# Patient Record
Sex: Male | Born: 2000
Health system: Southern US, Community
[De-identification: ages and names within clinical notes are randomized; demographics above are authoritative.]

## PROBLEM LIST (undated history)

## (undated) DIAGNOSIS — F909 Attention-deficit hyperactivity disorder, unspecified type: Secondary | ICD-10-CM

## (undated) HISTORY — PX: NO PAST SURGERIES: SHX2092

## (undated) HISTORY — DX: Attention-deficit hyperactivity disorder, unspecified type: F90.9

---
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
## 2019-01-31 DIAGNOSIS — F902 Attention-deficit hyperactivity disorder, combined type: Secondary | ICD-10-CM | POA: Diagnosis not present

## 2019-01-31 MED FILL — VYVANSE 70 MG CAPSULE: 70 | 30 days supply | Qty: 30 | Fill #0

## 2019-03-03 MED FILL — VYVANSE 70 MG CAPSULE: 70 | 30 days supply | Qty: 30 | Fill #0

## 2019-03-28 ENCOUNTER — Encounter: Payer: Self-pay | Admitting: Family Medicine

## 2019-03-28 ENCOUNTER — Ambulatory Visit (INDEPENDENT_AMBULATORY_CARE_PROVIDER_SITE_OTHER): Payer: 59 | Admitting: Family Medicine

## 2019-03-28 ENCOUNTER — Ambulatory Visit: Payer: Self-pay

## 2019-03-28 DIAGNOSIS — M79644 Pain in right finger(s): Secondary | ICD-10-CM | POA: Diagnosis not present

## 2019-03-28 NOTE — Progress Notes (Signed)
   Office Visit Note   Patient: Jeffery Jennings           Date of Birth: Dec 12, 2000           MRN: [id number] Visit Date: 03/28/2019 Requested by: Harrie Jeans, MD [address] [place],  [place] [address] PCP: Harrie Jeans, MD  Subjective: Chief Complaint  Patient presents with  . 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 Pain with certain movements.    HPI: He is a right-hand-dominant male with right thumb pain.  2 days ago at work at [employer], he got his thumb caught in a refrigerator door.  Immediate severe pain.  Able to continue working but with discomfort.  Pain is on the dorsum of his thumb near the IP joint.  No previous problems with his thumb.  Denies any numbness or tingling.              ROS: No fevers or chills.  All other systems were reviewed and are negative.  Objective: Vital Signs: There were no vitals taken for this visit.  Physical Exam:  General:  Alert and oriented, in no acute distress. Pulm:  Breathing unlabored. Psy:  Normal mood, congruent affect. Skin: No break in the skin.  There is subtle bruising on the dorsum of the proximal phalanx near the IP joint. Right thumb: Flexor and extensor tendon functions are intact.  No laxity of the IP joint or MCP joint collateral ligaments.  No tenderness on the palmar side of his thumb but moderately tender on the dorsum of the proximal phalanx just proximal to the IP joint.  Imaging: X-rays right thumb:  Normal alignment, no obvious fracture.    Assessment & Plan: 1.  2 days status post right thumb contusion with probable bone bruise. - Activities as tolerated.  X-ray in 2 weeks if pain persists.     Procedures: No procedures performed  No notes on file     PMFS History: Patient Active Problem List   Diagnosis Date Noted  . ADHD (attention deficit hyperactivity disorder) 09/05/2011   Past Medical History:  Diagnosis Date  . ADHD (attention  deficit hyperactivity disorder)     Family History  Problem Relation Age of Onset  . Hypertension Father     History reviewed. No pertinent surgical history. Social History   Occupational History  . Not on file  Tobacco Use  . Smoking status: Never Smoker  Substance and Sexual Activity  . Alcohol use: Not on file  . Drug use: Not on file  . Sexual activity: Not on file

## 2019-04-05 MED FILL — VYVANSE 70 MG CAPSULE: 70 | 30 days supply | Qty: 30 | Fill #0

## 2019-05-03 MED FILL — VYVANSE 70 MG CAPSULE: 70 | 30 days supply | Qty: 30 | Fill #0

## 2019-05-31 DIAGNOSIS — F902 Attention-deficit hyperactivity disorder, combined type: Secondary | ICD-10-CM | POA: Diagnosis not present

## 2019-05-31 MED FILL — VYVANSE 70 MG CAPSULE: 70 | 30 days supply | Qty: 30 | Fill #0

## 2019-06-08 ENCOUNTER — Ambulatory Visit: Payer: 59 | Admitting: Physician Assistant

## 2019-07-03 MED FILL — VYVANSE 70 MG CAPSULE: 70 | 30 days supply | Qty: 30 | Fill #0

## 2019-08-07 MED FILL — VYVANSE 70 MG CAPSULE: 70 | 30 days supply | Qty: 30 | Fill #0

## 2019-09-12 DIAGNOSIS — Z0001 Encounter for general adult medical examination with abnormal findings: Secondary | ICD-10-CM | POA: Diagnosis not present

## 2019-09-12 DIAGNOSIS — Z1331 Encounter for screening for depression: Secondary | ICD-10-CM | POA: Diagnosis not present

## 2019-09-12 DIAGNOSIS — Z68.41 Body mass index (BMI) pediatric, 5th percentile to less than 85th percentile for age: Secondary | ICD-10-CM | POA: Diagnosis not present

## 2019-09-12 DIAGNOSIS — Z713 Dietary counseling and surveillance: Secondary | ICD-10-CM | POA: Diagnosis not present

## 2019-09-12 DIAGNOSIS — F902 Attention-deficit hyperactivity disorder, combined type: Secondary | ICD-10-CM | POA: Diagnosis not present

## 2019-09-12 MED FILL — VYVANSE 70 MG CAPSULE: 70 | 30 days supply | Qty: 30 | Fill #0

## 2019-10-13 MED FILL — VYVANSE 70 MG CAPSULE: 70 | 30 days supply | Qty: 30 | Fill #0

## 2019-10-16 ENCOUNTER — Telehealth: Payer: Self-pay

## 2019-10-16 NOTE — Telephone Encounter (Signed)
Note for work for today was requested by pt. This was ok to do per Dr. Prince Rome   Note completed and given to pt.

## 2019-11-16 MED FILL — VYVANSE 70 MG CAPSULE: 70 | 30 days supply | Qty: 30 | Fill #0

## 2020-03-20 ENCOUNTER — Other Ambulatory Visit: Payer: Self-pay

## 2020-03-20 ENCOUNTER — Encounter: Payer: Self-pay | Admitting: Family Medicine

## 2020-03-20 ENCOUNTER — Ambulatory Visit (INDEPENDENT_AMBULATORY_CARE_PROVIDER_SITE_OTHER): Payer: 59 | Admitting: Family Medicine

## 2020-03-20 DIAGNOSIS — F909 Attention-deficit hyperactivity disorder, unspecified type: Secondary | ICD-10-CM | POA: Diagnosis not present

## 2020-03-20 MED ORDER — LISDEXAMFETAMINE DIMESYLATE 60 MG PO CAPS: 60.0000 mg | ORAL_CAPSULE | ORAL | 0 refills | Status: DC

## 2020-03-20 MED FILL — VYVANSE 60 MG CAPSULE: 60 | 30 days supply | Qty: 30 | Fill #0

## 2020-03-20 NOTE — Progress Notes (Signed)
   Office Visit Note   Patient: Jeffery Jennings           Date of Birth: [birth year]           MRN: [id number] Visit Date: 03/20/2020 Requested by: Chales Salmon, MD [address] [place],  [place] [address] PCP: Chales Salmon, MD  Subjective: Chief Complaint  Patient presents with  . Medication Refill    HPI: He is here for ADHD management.  He has "outgrown" his pediatrician office and would like to come here for primary care.  He has been on Vyvanse at the same dosage of 60 mg daily for many years now with good results and no side effects.  He would like to continue on this.  He recently got a new job at [employer].                ROS:  All other systems were reviewed and are negative.  Objective: Vital Signs: There were no vitals taken for this visit.  Physical Exam:  General:  Alert and oriented, in no acute distress. Pulm:  Breathing unlabored. Psy:  Normal mood, congruent affect.  CV: Regular rate and rhythm without murmurs, rubs, or gallops.  No peripheral edema.  2+ radial and posterior tibial pulses. Lungs: Clear to auscultation throughout with no wheezing or areas of consolidation. Ext:  No tremor.   Imaging: No results found.  Assessment & Plan: 1.  ADHD, well controlled -Refilled Vyvanse.  Follow-up in 6 months for recheck.  He will contact me monthly for refills when needed.     Procedures: No procedures performed  No notes on file     PMFS History: Patient Active Problem List   Diagnosis Date Noted  . ADHD (attention deficit hyperactivity disorder) 09/05/2011   Past Medical History:  Diagnosis Date  . ADHD (attention deficit hyperactivity disorder)     Family History  Problem Relation Age of Onset  . Hypertension Father     History reviewed. No pertinent surgical history. Social History   Occupational History  . Not on file  Tobacco Use  . Smoking status: Never Smoker  Substance and Sexual Activity  . Alcohol use: Not on file  . Drug  use: Not on file  . Sexual activity: Not on file

## 2020-04-22 ENCOUNTER — Telehealth: Payer: Self-pay

## 2020-04-22 MED ORDER — LISDEXAMFETAMINE DIMESYLATE 60 MG PO CAPS: 60.0000 mg | ORAL_CAPSULE | ORAL | 0 refills | Status: DC

## 2020-04-22 MED FILL — VYVANSE 60 MG CAPSULE: 60 | 30 days supply | Qty: 30 | Fill #0

## 2020-04-22 NOTE — Addendum Note (Signed)
Addended by: Lillia Carmel on: 04/22/2020 01:30 PM   Modules accepted: Orders

## 2020-04-22 NOTE — Telephone Encounter (Signed)
Patient requests refill on his Vyvanse to [facility]

## 2020-04-22 NOTE — Telephone Encounter (Signed)
Sent!

## 2020-05-23 DIAGNOSIS — H5213 Myopia, bilateral: Secondary | ICD-10-CM | POA: Diagnosis not present

## 2020-06-10 ENCOUNTER — Telehealth: Payer: Self-pay

## 2020-06-10 ENCOUNTER — Other Ambulatory Visit: Payer: Self-pay | Admitting: Family Medicine

## 2020-06-10 MED ORDER — LISDEXAMFETAMINE DIMESYLATE 60 MG PO CAPS: 60.0000 mg | ORAL_CAPSULE | ORAL | 0 refills | Status: DC

## 2020-06-10 MED FILL — VYVANSE 60 MG CAPSULE: 60 | 30 days supply | Qty: 30 | Fill #0

## 2020-06-10 NOTE — Addendum Note (Signed)
Addended by: Lillia Carmel on: 06/10/2020 02:57 PM   Modules accepted: Orders

## 2020-06-10 NOTE — Telephone Encounter (Signed)
Requests refill on Vyvanse, to [facility]. Please advise.

## 2020-06-10 NOTE — Telephone Encounter (Signed)
Sent!

## 2020-08-08 ENCOUNTER — Telehealth: Payer: Self-pay

## 2020-08-08 NOTE — Telephone Encounter (Signed)
Requests refill on Vyvanse to [facility]. He has #3 left.

## 2020-08-11 ENCOUNTER — Other Ambulatory Visit: Payer: Self-pay | Admitting: Family Medicine

## 2020-08-11 MED ORDER — LISDEXAMFETAMINE DIMESYLATE 60 MG PO CAPS: 60.0000 mg | ORAL_CAPSULE | ORAL | 0 refills | Status: DC

## 2020-08-11 NOTE — Telephone Encounter (Signed)
Rx sent 

## 2020-08-11 NOTE — Addendum Note (Signed)
Addended by: Lillia Carmel on: 08/11/2020 06:02 PM   Modules accepted: Orders

## 2020-09-11 ENCOUNTER — Telehealth: Payer: Self-pay

## 2020-09-11 MED ORDER — LISDEXAMFETAMINE DIMESYLATE 60 MG PO CAPS: 60.0000 mg | ORAL_CAPSULE | ORAL | 0 refills | Status: DC

## 2020-09-11 NOTE — Telephone Encounter (Signed)
Written.

## 2020-09-11 NOTE — Telephone Encounter (Signed)
Mom reports the patient has said he thinks he accidentally washed a pair of pants with the written Rx for Vyvanse in it (written so the patient can find which pharmacy has it for the cheapest). He is asking for a new written Rx. Please advise.

## 2020-09-11 NOTE — Telephone Encounter (Signed)
Patient's mom advised - she will take the Rx to him.

## 2020-09-11 NOTE — Addendum Note (Signed)
Addended by: Lillia Carmel on: 09/11/2020 03:13 PM   Modules accepted: Orders

## 2020-09-24 ENCOUNTER — Other Ambulatory Visit: Payer: Self-pay | Admitting: Family Medicine

## 2020-09-24 MED ORDER — AMPHETAMINE-DEXTROAMPHETAMINE 20 MG PO TABS: 40.0000 mg | ORAL_TABLET | Freq: Every day | ORAL | 0 refills | Status: DC

## 2020-11-05 ENCOUNTER — Telehealth: Payer: Self-pay

## 2020-11-05 MED ORDER — AMPHETAMINE-DEXTROAMPHETAMINE 20 MG PO TABS
40.0000 mg | ORAL_TABLET | Freq: Every day | ORAL | 0 refills | Status: DC
  Filled 2020-11-05: qty 60, 30d supply, fill #0

## 2020-11-05 NOTE — Addendum Note (Signed)
Addended by: Lillia Carmel on: 11/05/2020 05:10 PM   Modules accepted: Orders

## 2020-11-05 NOTE — Telephone Encounter (Signed)
The patient needs a refill on his Adderall 40 mg (2 daily) to [facility].

## 2020-11-05 NOTE — Telephone Encounter (Signed)
Advised the patient's mom, Martie Lee.

## 2020-11-05 NOTE — Telephone Encounter (Signed)
Sent!

## 2020-11-06 ENCOUNTER — Other Ambulatory Visit (HOSPITAL_COMMUNITY): Payer: Self-pay

## 2020-11-08 ENCOUNTER — Telehealth: Payer: Self-pay

## 2020-11-08 MED ORDER — AMPHETAMINE-DEXTROAMPHETAMINE 20 MG PO TABS: 40.0000 mg | ORAL_TABLET | Freq: Every day | ORAL | 0 refills | Status: DC

## 2020-11-08 NOTE — Telephone Encounter (Signed)
Patient's mom advised 

## 2020-11-08 NOTE — Addendum Note (Signed)
Addended by: Lillia Carmel on: 11/08/2020 08:49 AM   Modules accepted: Orders

## 2020-11-08 NOTE — Telephone Encounter (Signed)
The patient no longer uses the [facility]. Could the Adderall be sent over to [facility] instead?

## 2020-11-08 NOTE — Telephone Encounter (Signed)
Done

## 2020-11-11 ENCOUNTER — Other Ambulatory Visit (HOSPITAL_COMMUNITY): Payer: Self-pay

## 2020-11-13 ENCOUNTER — Other Ambulatory Visit (HOSPITAL_COMMUNITY): Payer: Self-pay

## 2021-01-02 ENCOUNTER — Telehealth: Payer: Self-pay

## 2021-01-02 NOTE — Telephone Encounter (Signed)
Requests refill on Adderall 20 mg - [facility].

## 2021-01-03 MED ORDER — AMPHETAMINE-DEXTROAMPHETAMINE 20 MG PO TABS: 40.0000 mg | ORAL_TABLET | Freq: Every day | ORAL | 0 refills | Status: DC

## 2021-01-03 NOTE — Addendum Note (Signed)
Addended by: Lillia Carmel on: 01/03/2021 07:57 AM   Modules accepted: Orders

## 2021-02-18 ENCOUNTER — Telehealth: Payer: Self-pay

## 2021-02-18 MED ORDER — AMPHETAMINE-DEXTROAMPHETAMINE 20 MG PO TABS: 40.0000 mg | ORAL_TABLET | Freq: Every day | ORAL | 0 refills | Status: DC

## 2021-02-18 NOTE — Telephone Encounter (Signed)
Requests refill of vyvanse to [facility].

## 2021-02-18 NOTE — Addendum Note (Signed)
Addended by: Lillia Carmel on: 02/18/2021 04:25 PM   Modules accepted: Orders

## 2021-02-21 ENCOUNTER — Telehealth: Payer: Self-pay | Admitting: Radiology

## 2021-02-21 MED ORDER — AMPHETAMINE-DEXTROAMPHETAMINE 20 MG PO TABS: 40.0000 mg | ORAL_TABLET | Freq: Every day | ORAL | 0 refills | Status: DC

## 2021-02-21 NOTE — Telephone Encounter (Signed)
Per patient's mom, Adderall was sent to the incorrect pharmacy. [facility] [place] and [facility] [address]. Could you please resend?

## 2021-02-21 NOTE — Addendum Note (Signed)
Addended by: Lillia Carmel on: 02/21/2021 06:33 PM   Modules accepted: Orders

## 2021-02-24 ENCOUNTER — Telehealth: Payer: Self-pay | Admitting: Radiology

## 2021-02-24 NOTE — Telephone Encounter (Signed)
noted 

## 2021-02-24 NOTE — Telephone Encounter (Signed)
Patient was unable to [facility] due to [facility] [place] [facility] and holding for him. I called [facility] and they canceled rx. I called Summer [facility], [address], and advised. She ran rx again and will get filled for patient.

## 2021-03-21 ENCOUNTER — Telehealth: Payer: Self-pay

## 2021-03-21 MED ORDER — AMPHETAMINE-DEXTROAMPHETAMINE 20 MG PO TABS: 40.0000 mg | ORAL_TABLET | Freq: Every day | ORAL | 0 refills | Status: DC

## 2021-03-21 NOTE — Telephone Encounter (Signed)
The patient is asking for 3 months of the Adderall 40 mg bid, if possible, while he finds a new doctor. [facility] [address] Please advise.

## 2021-03-21 NOTE — Telephone Encounter (Signed)
Patient's mom, Martie Lee, advised.

## 2021-03-21 NOTE — Addendum Note (Signed)
Addended by: Lillia Carmel on: 03/21/2021 11:43 AM   Modules accepted: Orders

## 2021-10-22 ENCOUNTER — Encounter (HOSPITAL_BASED_OUTPATIENT_CLINIC_OR_DEPARTMENT_OTHER): Payer: Self-pay | Admitting: Family Medicine

## 2021-10-22 ENCOUNTER — Other Ambulatory Visit: Payer: Self-pay

## 2021-10-22 ENCOUNTER — Ambulatory Visit (INDEPENDENT_AMBULATORY_CARE_PROVIDER_SITE_OTHER): Payer: Self-pay | Admitting: Family Medicine

## 2021-10-22 VITALS — BP 102/60 | HR 98 | Temp 98.1°F | Ht 68.0 in | Wt 159.2 lb

## 2021-10-22 DIAGNOSIS — Z0000 Encounter for general adult medical examination without abnormal findings: Secondary | ICD-10-CM

## 2021-10-22 DIAGNOSIS — F909 Attention-deficit hyperactivity disorder, unspecified type: Secondary | ICD-10-CM

## 2021-10-22 MED ORDER — AMPHETAMINE-DEXTROAMPHETAMINE 20 MG PO TABS: 40.0000 mg | ORAL_TABLET | Freq: Every day | ORAL | 0 refills | Status: DC

## 2021-10-22 NOTE — Patient Instructions (Addendum)
Living With Attention Deficit Hyperactivity Disorder If you have been diagnosed with attention deficit hyperactivity disorder (ADHD), you may be relieved that you now know why you have felt or behaved a certain way. Still, you may feel overwhelmed about the treatment ahead. You may also wonder how to get the support you need and how to deal with the condition day-to-day. With treatment and support, you can live with ADHD and manage your symptoms. How to manage lifestyle changes Managing stress Stress is your body's reaction to life changes and events, both good and bad. To cope with the stress of an ADHD diagnosis, it may help to: Learn more about ADHD. Exercise regularly. Even a short daily walk can lower stress levels. Participate in training or education programs (including social skills training classes) that teach you to deal with symptoms.  Medicines Your health care provider may suggest certain medicines if he or she feels that they will help to improve your condition. Stimulant medicines are usually prescribed to treat ADHD, and therapy may also be prescribed. It is important to: Avoid using alcohol and other substances that may prevent your medicines from working properly (may interact). Talk with your pharmacist or health care provider about all the medicines that you take, their possible side effects, and what medicines are safe to take together. Make it your goal to take part in all treatment decisions (shared decision-making). Ask about possible side effects of medicines that your health care provider recommends, and tell him or her how you feel about having those side effects. It is best if shared decision-making with your health care provider is part of your total treatment plan. Relationships To strengthen your relationships with family members while treating your condition, consider taking part in family therapy. You might also attend self-help groups alone or with a loved one. Be  honest about how your symptoms affect your relationships. Make an effort to communicate respectfully instead of fighting, and find ways to show others that you care. Psychotherapy may be useful in helping you cope with how ADHD affects your relationships. How to recognize changes in your condition The following signs may mean that your treatment is working well and your condition is improving: Consistently being on time for appointments. Being more organized at home and work. Other people noticing improvements in your behavior. Achieving goals that you set for yourself. Thinking more clearly. The following signs may mean that your treatment is not working very well: Feeling impatience or more confusion. Missing, forgetting, or being late for appointments. An increasing sense of disorganization and messiness. More difficulty in reaching goals that you set for yourself. Loved ones becoming angry or frustrated with you. Follow these instructions at home: Take over-the-counter and prescription medicines only as told by your health care provider. Check with your health care provider before taking any new medicines. Create structure and an organized atmosphere at home. For example: Make a list of tasks, then rank them from most important to least important. Work on one task at a time until your listed tasks are done. Make a daily schedule and follow it consistently every day. Use an appointment calendar, and check it 2 or 3 times a day to keep on track. Keep it with you when you leave the house. Create spaces where you keep certain things, and always put things back in their places after you use them. Keep all follow-up visits as told by your health care provider. This is important. Where to find support Talking to others    Keep emotion out of important discussions and speak in a calm, logical way. ?Listen closely and patiently to your loved ones. Try to understand their point of view, and try to  avoid getting defensive. ?Take responsibility for the consequences of your actions. ?Ask that others do not take your behaviors personally. ?Aim to solve problems as they come up, and express your feelings instead of bottling them up. ?Talk openly about what you need from your loved ones and how they can support you. ?Consider going to family therapy sessions or having your family meet with a specialist who deals with ADHD-related behavior problems. ?Finances ?Not all insurance plans cover mental health care, so it is important to check with your insurance carrier. If paying for co-pays or counseling services is a problem, search for a local or county mental health care center. Public mental health care services may be offered there at a low cost or no cost when you are not able to see a private health care provider. ?If you are taking medicine for ADHD, you may be able to get the generic form, which may be less expensive than brand-name medicine. Some makers of prescription medicines also offer help to patients who cannot afford the medicines that they need. ?Questions to ask your health care provider: ?What are the risks and benefits of taking medicines? ?Would I benefit from therapy? ?How often should I follow up with a health care provider? ?Contact a health care provider if: ?You have side effects from your medicines, such as: ?Repeated muscle twitches, coughing, or speech outbursts. ?Sleep problems. ?Loss of appetite. ?Depression. ?New or worsening behavior problems. ?Dizziness. ?Unusually fast heartbeat. ?Stomach pains. ?Headaches. ?Get help right away if: ?You have a severe reaction to a medicine. ?Your behavior suddenly gets worse. ?Summary ?With treatment and support, you can live with ADHD and manage your symptoms. ?The medicines that are most often prescribed for ADHD are stimulants. ?Consider taking part in family therapy or self-help groups with family members or friends. ?When you talk with friends  and family about your ADHD, be patient and communicate openly. ?Take over-the-counter and prescription medicines only as told by your health care provider. Check with your health care provider before taking any new medicines. ?This information is not intended to replace advice given to you by your health care provider. Make sure you discuss any questions you have with your health care provider. ?Document Revised: 01/03/2020 Document Reviewed: 01/03/2020 ?Elsevier Patient Education ? 2022 Elsevier Inc. ? ?Medication Instructions:  ?Your physician recommends that you continue on your current medications as directed. Please refer to the Current Medication list given to you today. ?--If you need a refill on any your medications before your next appointment, please call your pharmacy first. If no refills are authorized on file call the office.-- ? ? ? ?Follow-Up: ?Your next appointment:   ?Your physician recommends that you schedule a follow-up appointment in: 4 months for a wellness exam with Dr. de [place] ? ?You will receive a text message or e-mail with a link to a survey about your care and experience with [place] today! We would greatly appreciate your feedback!  ? ?Thanks for letting us be apart of your health journey!!  ?Primary Care and Sports Medicine  ? ?Dr. Marcy Salvo de [place]  ? ?We encourage you to activate your patient portal called "MyChart".  Sign up information is provided on this After Visit Summary.  MyChart is used to connect with patients for Virtual Visits (Telemedicine).  Patients are  able to view lab/test results, encounter notes, upcoming appointments, etc.  Non-urgent messages can be sent to your provider as well. To learn more about what you can do with MyChart, please visit --  [email or web address].    ?

## 2021-10-22 NOTE — Progress Notes (Signed)
? ?New Patient Office Visit ? ?Subjective:  ?Patient ID: Jeffery Jennings, male    DOB: [birth year]  Age: 21 y.o. MRN: [id number] ? ?CC:  ?Chief Complaint  ?Patient presents with  ? New Patient (Initial Visit)  ? ? ?HPI ?Jeffery Jennings is a 21 year old male presenting to establish in clinic.  He has current concerns as outlined above.  Reports past medical history of ADHD. ? ?Patient was previously seeing Dr. Prince Rome most recently who is managing medication for ADHD.  He reports that he was diagnosed many years ago as a child.  At 1 point, was treating with Vyvanse, however this became cost prohibitive and he was transitioned to Adderall, 40 mg daily.  He has been on this dose medication for about 1 to 2 years.  Has been tolerating medication well, feels that symptoms are well controlled with current dosing.  He denies any issues with chest pain, palpitations, sleep issues.  Requesting refill today. ? ?Patient is originally from [place], currently works at [employer].  Outside of work his hobbies and activities are somewhat limited ? ?Past Medical History:  ?Diagnosis Date  ? ADHD (attention deficit hyperactivity disorder)   ? ? ?History reviewed. No pertinent surgical history. ? ?Family History  ?Problem Relation Age of Onset  ? Hypertension Father   ? ? ?Social History  ? ?Socioeconomic History  ? Marital status: Single  ?  Spouse name: Not on file  ? Number of children: Not on file  ? Years of education: Not on file  ? Highest education level: Not on file  ?Occupational History  ? Not on file  ?Tobacco Use  ? Smoking status: Never  ? Smokeless tobacco: Not on file  ?Substance and Sexual Activity  ? Alcohol use: Not on file  ? Drug use: Not on file  ? Sexual activity: Not on file  ?Other Topics Concern  ? Not on file  ?Social History Narrative  ? Not on file  ? ?Social Determinants of Health  ? ?Financial Resource Strain: Not on file  ?Food Insecurity: Not on file  ?Transportation Needs: Not on file  ?Physical  Activity: Not on file  ?Stress: Not on file  ?Social Connections: Not on file  ?Intimate Partner Violence: Not on file  ? ? ?Objective:  ? ?Today's Vitals: BP 102/60   Pulse 98   Temp 98.1 ?F (36.7 ?C)   Ht 5\' 8"  (1.727 m)   Wt 159 lb 3.2 oz (72.2 kg)   SpO2 97%   BMI 24.21 kg/m?  ? ?Physical Exam ? ?21 year old male in no acute distress ?Cardiovascular exam with regular rate and rhythm, no murmur appreciated ?Lungs clear to auscultation bilaterally ? ?Assessment & Plan:  ? ?Problem List Items Addressed This Visit   ? ?  ? Other  ? ADHD (attention deficit hyperactivity disorder) - Primary  ?  Reviewed PDMP today, no red flags ?Clinically, patient appears to be tolerating medication well, will provide refill today ?Plan for follow-up in about 3 months to monitor progress with medication as well as complete CPE ?  ?  ? Relevant Medications  ? amphetamine-dextroamphetamine (ADDERALL) 20 MG tablet  ? ?Other Visit Diagnoses   ? ? Wellness examination      ? Relevant Orders  ? CBC with Differential/Platelet  ? Comprehensive metabolic panel  ? Lipid panel  ? ?  ? ? ?Outpatient Encounter Medications as of 10/22/2021  ?Medication Sig  ? MELATONIN PO Take by mouth.  ? [DISCONTINUED] amphetamine-dextroamphetamine (ADDERALL)  20 MG tablet Take 2 tablets (40 mg total) by mouth daily.  ? amphetamine-dextroamphetamine (ADDERALL) 20 MG tablet Take 2 tablets (40 mg total) by mouth daily.  ? [DISCONTINUED] lisdexamfetamine (VYVANSE) 60 MG capsule Take 1 capsule (60 mg total) by mouth every morning.  ? ?No facility-administered encounter medications on file as of 10/22/2021.  ? ? ?Follow-up: Return in about 3 months (around 01/22/2022) for CPE.  Plan for follow-up in about 3 months to complete CPE, monitor medication.  Recommend nurse visit 1 week prior to next appointment to complete labs, will review at next office visit ? ?Faige Seely J [place], MD ? ?

## 2021-10-22 NOTE — Assessment & Plan Note (Signed)
Reviewed PDMP today, no red flags ?Clinically, patient appears to be tolerating medication well, will provide refill today ?Plan for follow-up in about 3 months to monitor progress with medication as well as complete CPE ?

## 2021-12-02 ENCOUNTER — Other Ambulatory Visit (HOSPITAL_BASED_OUTPATIENT_CLINIC_OR_DEPARTMENT_OTHER): Payer: Self-pay | Admitting: Family Medicine

## 2021-12-02 DIAGNOSIS — F909 Attention-deficit hyperactivity disorder, unspecified type: Secondary | ICD-10-CM

## 2021-12-03 MED ORDER — AMPHETAMINE-DEXTROAMPHETAMINE 20 MG PO TABS: 40.0000 mg | ORAL_TABLET | Freq: Every day | ORAL | 0 refills | Status: DC

## 2022-01-12 ENCOUNTER — Other Ambulatory Visit (HOSPITAL_BASED_OUTPATIENT_CLINIC_OR_DEPARTMENT_OTHER): Payer: Self-pay | Admitting: Family Medicine

## 2022-01-12 DIAGNOSIS — F909 Attention-deficit hyperactivity disorder, unspecified type: Secondary | ICD-10-CM

## 2022-01-14 MED ORDER — AMPHETAMINE-DEXTROAMPHETAMINE 20 MG PO TABS: 40.0000 mg | ORAL_TABLET | Freq: Every day | ORAL | 0 refills | Status: DC

## 2022-01-22 ENCOUNTER — Ambulatory Visit (INDEPENDENT_AMBULATORY_CARE_PROVIDER_SITE_OTHER): Payer: Self-pay | Admitting: Family Medicine

## 2022-01-22 ENCOUNTER — Encounter (HOSPITAL_BASED_OUTPATIENT_CLINIC_OR_DEPARTMENT_OTHER): Payer: Self-pay | Admitting: Family Medicine

## 2022-01-22 DIAGNOSIS — Z0000 Encounter for general adult medical examination without abnormal findings: Secondary | ICD-10-CM

## 2022-01-22 NOTE — Progress Notes (Signed)
Subjective:    CC: Annual Physical Exam  HPI:  Jeffery Jennings is a 21 y.o. presenting for annual physical  I reviewed the past medical history, family history, social history, surgical history, and allergies today and no changes were needed.  Please see the problem list section below in epic for further details.  Past Medical History: Past Medical History:  Diagnosis Date   ADHD (attention deficit hyperactivity disorder)    Past Surgical History: History reviewed. No pertinent surgical history. Social History: Social History   Socioeconomic History   Marital status: Single    Spouse name: Not on file   Number of children: Not on file   Years of education: Not on file   Highest education level: Not on file  Occupational History   Not on file  Tobacco Use   Smoking status: Never   Smokeless tobacco: Not on file  Substance and Sexual Activity   Alcohol use: Not on file   Drug use: Not on file   Sexual activity: Not on file  Other Topics Concern   Not on file  Social History Narrative   Not on file   Social Determinants of Health   Financial Resource Strain: Not on file  Food Insecurity: Not on file  Transportation Needs: Not on file  Physical Activity: Not on file  Stress: Not on file  Social Connections: Not on file   Family History: Family History  Problem Relation Age of Onset   Hypertension Father    Allergies: No Known Allergies Medications: See med rec.  Review of Systems: No headache, visual changes, nausea, vomiting, diarrhea, constipation, dizziness, abdominal pain, skin rash, fevers, chills, night sweats, swollen lymph nodes, weight loss, chest pain, body aches, joint swelling, muscle aches, shortness of breath, mood changes, visual or auditory hallucinations.  Objective:    BP 112/67   Pulse 67   Temp 97.8 F (36.6 C) (Oral)   Ht 5\' 8"  (1.727 m)   Wt 167 lb 14.4 oz (76.2 kg)   SpO2 100%   BMI 25.53 kg/m   General: Well Developed, well  nourished, and in no acute distress.  Neuro: Alert and oriented x3, extra-ocular muscles intact, sensation grossly intact. Cranial nerves II through XII are intact, motor, sensory, and coordinative functions are all intact. HEENT: Normocephalic, atraumatic, pupils equal round reactive to light, neck supple, no masses, no lymphadenopathy, thyroid nonpalpable. Oropharynx, nasopharynx, external ear canals are unremarkable. Skin: Warm and dry, no rashes noted.  Cardiac: Regular rate and rhythm, no murmurs rubs or gallops.  Respiratory: Clear to auscultation bilaterally. Not using accessory muscles, speaking in full sentences.  Abdominal: Soft, nontender, nondistended, positive bowel sounds, no masses, no organomegaly.  Musculoskeletal: Shoulder, elbow, wrist, hip, knee, ankle stable, and with full range of motion.  Impression and Recommendations:    Wellness examination Routine HCM labs ordered. HCM reviewed/discussed. Anticipatory guidance regarding healthy weight, lifestyle and choices given. Recommend healthy diet.  Recommend approximately 150 minutes/week of moderate intensity exercise Recommend regular dental and vision exams Always use seatbelt/lap and shoulder restraints Recommend using smoke alarms and checking batteries at least twice a year Recommend using sunscreen when outside Discussed tetanus immunization recommendations, patient is UTD, but will be due later this month. Recommend receiving next booster at [place] with labs or next office visit for med check Patient is UTD with HPV series  Doing well with current dose of Adderall, no concerns at this time related to med. Not needing refill today.  Return in  about 3 months (around 04/24/2022) for Med check.   ___________________________________________ Nandan Willems de [place], MD, ABFM, [facility] Primary [facility] and Sports Medicine [facility]

## 2022-01-22 NOTE — Assessment & Plan Note (Signed)
Routine HCM labs ordered. HCM reviewed/discussed. Anticipatory guidance regarding healthy weight, lifestyle and choices given. Recommend healthy diet.  Recommend approximately 150 minutes/week of moderate intensity exercise Recommend regular dental and vision exams Always use seatbelt/lap and shoulder restraints Recommend using smoke alarms and checking batteries at least twice a year Recommend using sunscreen when outside Discussed tetanus immunization recommendations, patient is UTD, but will be due later this month. Recommend receiving next booster at [place] with labs or next office visit for med check Patient is UTD with HPV series

## 2022-01-22 NOTE — Patient Instructions (Signed)

## 2022-01-26 ENCOUNTER — Ambulatory Visit (HOSPITAL_BASED_OUTPATIENT_CLINIC_OR_DEPARTMENT_OTHER): Payer: Self-pay

## 2022-01-26 DIAGNOSIS — Z0000 Encounter for general adult medical examination without abnormal findings: Secondary | ICD-10-CM

## 2022-01-27 LAB — CBC WITH DIFFERENTIAL/PLATELET
Basophils Absolute: 0.1 10*3/uL (ref 0.0–0.2)
Basos: 1 %
EOS (ABSOLUTE): 0.4 10*3/uL (ref 0.0–0.4)
Eos: 5 %
Hematocrit: 50.1 % (ref 37.5–51.0)
Hemoglobin: 16.6 g/dL (ref 13.0–17.7)
Immature Grans (Abs): 0 10*3/uL (ref 0.0–0.1)
Immature Granulocytes: 0 %
Lymphocytes Absolute: 3.4 10*3/uL — ABNORMAL HIGH (ref 0.7–3.1)
Lymphs: 42 %
MCH: 29.9 pg (ref 26.6–33.0)
MCHC: 33.1 g/dL (ref 31.5–35.7)
MCV: 90 fL (ref 79–97)
Monocytes Absolute: 0.6 10*3/uL (ref 0.1–0.9)
Monocytes: 8 %
Neutrophils Absolute: 3.6 10*3/uL (ref 1.4–7.0)
Neutrophils: 44 %
Platelets: 275 10*3/uL (ref 150–450)
RBC: 5.56 x10E6/uL (ref 4.14–5.80)
RDW: 12.3 % (ref 11.6–15.4)
WBC: 8 10*3/uL (ref 3.4–10.8)

## 2022-01-27 LAB — COMPREHENSIVE METABOLIC PANEL
ALT: 62 IU/L — ABNORMAL HIGH (ref 0–44)
AST: 33 IU/L (ref 0–40)
Albumin/Globulin Ratio: 2 (ref 1.2–2.2)
Albumin: 4.6 g/dL (ref 4.1–5.2)
Alkaline Phosphatase: 47 IU/L (ref 44–121)
BUN/Creatinine Ratio: 14 (ref 9–20)
BUN: 12 mg/dL (ref 6–20)
Bilirubin Total: 0.5 mg/dL (ref 0.0–1.2)
CO2: 25 mmol/L (ref 20–29)
Calcium: 9.7 mg/dL (ref 8.7–10.2)
Chloride: 104 mmol/L (ref 96–106)
Creatinine, Ser: 0.86 mg/dL (ref 0.76–1.27)
Globulin, Total: 2.3 g/dL (ref 1.5–4.5)
Glucose: 90 mg/dL (ref 70–99)
Potassium: 4.5 mmol/L (ref 3.5–5.2)
Sodium: 145 mmol/L — ABNORMAL HIGH (ref 134–144)
Total Protein: 6.9 g/dL (ref 6.0–8.5)
eGFR: 126 mL/min/{1.73_m2} (ref >59)

## 2022-01-27 LAB — LIPID PANEL
Chol/HDL Ratio: 3.6 ratio (ref 0.0–5.0)
Cholesterol, Total: 175 mg/dL (ref 100–199)
HDL: 49 mg/dL (ref >39)
LDL Chol Calc (NIH): 108 mg/dL — ABNORMAL HIGH (ref 0–99)
Triglycerides: 98 mg/dL (ref 0–149)
VLDL Cholesterol Cal: 18 mg/dL (ref 5–40)

## 2022-02-26 ENCOUNTER — Other Ambulatory Visit (HOSPITAL_BASED_OUTPATIENT_CLINIC_OR_DEPARTMENT_OTHER): Payer: Self-pay | Admitting: Family Medicine

## 2022-02-26 DIAGNOSIS — F909 Attention-deficit hyperactivity disorder, unspecified type: Secondary | ICD-10-CM

## 2022-02-27 MED ORDER — AMPHETAMINE-DEXTROAMPHETAMINE 20 MG PO TABS: 40.0000 mg | ORAL_TABLET | Freq: Every day | ORAL | 0 refills | Status: DC

## 2022-04-20 ENCOUNTER — Other Ambulatory Visit (HOSPITAL_BASED_OUTPATIENT_CLINIC_OR_DEPARTMENT_OTHER): Payer: Self-pay | Admitting: Family Medicine

## 2022-04-20 DIAGNOSIS — F909 Attention-deficit hyperactivity disorder, unspecified type: Secondary | ICD-10-CM

## 2022-04-23 ENCOUNTER — Encounter (HOSPITAL_BASED_OUTPATIENT_CLINIC_OR_DEPARTMENT_OTHER): Payer: Self-pay | Admitting: Family Medicine

## 2022-04-23 ENCOUNTER — Ambulatory Visit (INDEPENDENT_AMBULATORY_CARE_PROVIDER_SITE_OTHER): Payer: 59 | Admitting: Family Medicine

## 2022-04-23 DIAGNOSIS — F909 Attention-deficit hyperactivity disorder, unspecified type: Secondary | ICD-10-CM

## 2022-04-23 MED ORDER — AMPHETAMINE-DEXTROAMPHETAMINE 20 MG PO TABS: 40.0000 mg | ORAL_TABLET | Freq: Every day | ORAL | 0 refills | Status: DC

## 2022-04-23 NOTE — Progress Notes (Signed)
    Procedures performed today:    None.  Independent interpretation of notes and tests performed by another provider:   None.  Brief History, Exam, Impression, and Recommendations:    BP 127/74   Pulse 77   Ht 5\' 8"  (1.727 m)   Wt 175 lb 14.4 oz (79.8 kg)   SpO2 99%   BMI 26.75 kg/m   ADHD (attention deficit hyperactivity disorder) Patient reports that he has been doing well with current dose of Adderall, requesting refill today.  He has not had any issues related to chest pain, palpitations, appetite changes, sleep issues.  He feels that symptoms are adequately controlled with current medication regimen On exam, patient is in no acute distress, vital signs stable.  Cardiovascular exam with regular rate and rhythm, no murmur appreciated PDMP reviewed, no red flags Refill of Adderall provided today Plan for follow-up in about 3 months, can be virtual if desired  Return in about 3 months (around 07/23/2022) for med check, can be virtual if preferred.   ___________________________________________ Matea Stanard de [place], MD, ABFM, [facility] Primary [facility] and [place]

## 2022-04-23 NOTE — Assessment & Plan Note (Signed)
Patient reports that he has been doing well with current dose of Adderall, requesting refill today.  He has not had any issues related to chest pain, palpitations, appetite changes, sleep issues.  He feels that symptoms are adequately controlled with current medication regimen On exam, patient is in no acute distress, vital signs stable.  Cardiovascular exam with regular rate and rhythm, no murmur appreciated PDMP reviewed, no red flags Refill of Adderall provided today Plan for follow-up in about 3 months, can be virtual if desired

## 2022-04-23 NOTE — Patient Instructions (Signed)
  Medication Instructions:  Your physician recommends that you continue on your current medications as directed. Please refer to the Current Medication list given to you today. --If you need a refill on any your medications before your next appointment, please call your pharmacy first. If no refills are authorized on file call the office.-- Lab Work: Your physician has recommended that you have lab work today: No If you have labs (blood work) drawn today and your tests are completely normal, you will receive your results via [place] a phone call from our staff.  Please ensure you check your voicemail in the event that you authorized detailed messages to be left on a delegated number. If you have any lab test that is abnormal or we need to change your treatment, we will call you to review the results.  Referrals/Procedures/Imaging: No  Follow-Up: Your next appointment:   Your physician recommends that you schedule a follow-up appointment in: 3 months follow-up it can be virtual with Dr. de [place].  You will receive a text message or e-mail with a link to a survey about your care and experience with [place] today! We would greatly appreciate your feedback!   Thanks for letting us be apart of your health journey!!  Primary Care and Sports Medicine   Dr. Arlina Robes [place]   We encourage you to activate your patient portal called "MyChart".  Sign up information is provided on this After Visit Summary.  MyChart is used to connect with patients for Virtual Visits (Telemedicine).  Patients are able to view lab/test results, encounter notes, upcoming appointments, etc.  Non-urgent messages can be sent to your provider as well. To learn more about what you can do with MyChart, please visit --  [email or web address].

## 2022-05-26 ENCOUNTER — Other Ambulatory Visit (HOSPITAL_BASED_OUTPATIENT_CLINIC_OR_DEPARTMENT_OTHER): Payer: Self-pay | Admitting: Family Medicine

## 2022-05-26 DIAGNOSIS — F909 Attention-deficit hyperactivity disorder, unspecified type: Secondary | ICD-10-CM

## 2022-05-28 MED ORDER — AMPHETAMINE-DEXTROAMPHETAMINE 20 MG PO TABS: 40.0000 mg | ORAL_TABLET | Freq: Every day | ORAL | 0 refills | Status: DC

## 2022-06-08 ENCOUNTER — Ambulatory Visit (INDEPENDENT_AMBULATORY_CARE_PROVIDER_SITE_OTHER): Payer: 59 | Admitting: Orthopedic Surgery

## 2022-06-08 ENCOUNTER — Encounter: Payer: Self-pay | Admitting: Orthopedic Surgery

## 2022-06-08 ENCOUNTER — Ambulatory Visit (INDEPENDENT_AMBULATORY_CARE_PROVIDER_SITE_OTHER): Payer: 59

## 2022-06-08 DIAGNOSIS — M79641 Pain in right hand: Secondary | ICD-10-CM
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
## 2022-06-09 ENCOUNTER — Other Ambulatory Visit: Payer: Self-pay | Admitting: Orthopedic Surgery

## 2022-06-16 ENCOUNTER — Encounter (HOSPITAL_COMMUNITY): Payer: Self-pay | Admitting: Orthopedic Surgery

## 2022-06-16 ENCOUNTER — Other Ambulatory Visit: Payer: Self-pay

## 2022-06-16 NOTE — Progress Notes (Signed)
Jeffery Jennings denies chest pain or shortness of breath. Patient denies having any s/s of Covid in his  household, also denies any known exposure to Covid.   Joselyn Glassman 's PCP is Dr. Sinclair Ship.  Celestine reports that he vapes "a lot", I instructed patient to not vape anymore today.

## 2022-06-17 ENCOUNTER — Ambulatory Visit (HOSPITAL_BASED_OUTPATIENT_CLINIC_OR_DEPARTMENT_OTHER): Payer: 59 | Admitting: Anesthesiology

## 2022-06-17 ENCOUNTER — Ambulatory Visit (HOSPITAL_COMMUNITY): Payer: 59

## 2022-06-17 ENCOUNTER — Encounter (HOSPITAL_COMMUNITY): Payer: Self-pay | Admitting: Orthopedic Surgery

## 2022-06-17 ENCOUNTER — Ambulatory Visit (HOSPITAL_COMMUNITY)
Admission: RE | Admit: 2022-06-17 | Discharge: 2022-06-17 | Disposition: A | Discharge status: Home / Self Care | Payer: 59 | Attending: Orthopedic Surgery | Admitting: Orthopedic Surgery

## 2022-06-17 ENCOUNTER — Encounter (HOSPITAL_COMMUNITY)
Admission: RE | Disposition: A | Discharge status: Home / Self Care | Payer: Self-pay | Source: Home / Self Care | Attending: Orthopedic Surgery

## 2022-06-17 ENCOUNTER — Ambulatory Visit (HOSPITAL_COMMUNITY): Payer: 59 | Admitting: Anesthesiology

## 2022-06-17 ENCOUNTER — Other Ambulatory Visit: Payer: Self-pay

## 2022-06-17 DIAGNOSIS — S62616A Displaced fracture of proximal phalanx of right little finger, initial encounter for closed fracture: Secondary | ICD-10-CM | POA: Diagnosis present

## 2022-06-17 DIAGNOSIS — X58XXXA Exposure to other specified factors, initial encounter: Secondary | ICD-10-CM | POA: Diagnosis not present

## 2022-06-17 HISTORY — PX: CLOSED REDUCTION FINGER WITH PERCUTANEOUS PINNING: SHX5612

## 2022-06-17 SURGERY — CLOSED REDUCTION, FINGER, WITH PERCUTANEOUS PINNING
Anesthesia: Monitor Anesthesia Care | Laterality: Right

## 2022-06-17 MED ORDER — MIDAZOLAM HCL 2 MG/2ML IJ SOLN: 2.0000 mg | Freq: Once | INTRAMUSCULAR | Status: AC

## 2022-06-17 MED ORDER — PROPOFOL 10 MG/ML IV BOLUS
INTRAVENOUS | Status: AC
  Filled 2022-06-17: qty 20

## 2022-06-17 MED ORDER — CHLORHEXIDINE GLUCONATE 0.12 % MT SOLN
OROMUCOSAL | Status: AC
  Administered 2022-06-17: 15 mL via OROMUCOSAL
  Filled 2022-06-17: qty 15

## 2022-06-17 MED ORDER — PROPOFOL 500 MG/50ML IV EMUL
INTRAVENOUS | Status: DC | PRN
  Administered 2022-06-17: 150 ug/kg/min via INTRAVENOUS

## 2022-06-17 MED ORDER — FENTANYL CITRATE (PF) 100 MCG/2ML IJ SOLN: 100.0000 ug | Freq: Once | INTRAMUSCULAR | Status: AC

## 2022-06-17 MED ORDER — PROPOFOL 1000 MG/100ML IV EMUL
INTRAVENOUS | Status: AC
  Filled 2022-06-17: qty 100

## 2022-06-17 MED ORDER — LACTATED RINGERS IV SOLN: INTRAVENOUS | Status: DC

## 2022-06-17 MED ORDER — MIDAZOLAM HCL 2 MG/2ML IJ SOLN
INTRAMUSCULAR | Status: AC
  Administered 2022-06-17: 2 mg via INTRAVENOUS
  Filled 2022-06-17: qty 2

## 2022-06-17 MED ORDER — CEFAZOLIN SODIUM-DEXTROSE 2-4 GM/100ML-% IV SOLN
INTRAVENOUS | Status: AC
  Filled 2022-06-17: qty 100

## 2022-06-17 MED ORDER — FENTANYL CITRATE (PF) 100 MCG/2ML IJ SOLN
INTRAMUSCULAR | Status: AC
  Administered 2022-06-17: 100 ug via INTRAVENOUS
  Filled 2022-06-17: qty 2

## 2022-06-17 MED ORDER — OXYCODONE-ACETAMINOPHEN 5-325 MG PO TABS: 1.0000 | ORAL_TABLET | ORAL | 0 refills | Status: AC | PRN

## 2022-06-17 MED ORDER — ORAL CARE MOUTH RINSE: 15.0000 mL | Freq: Once | OROMUCOSAL | Status: AC

## 2022-06-17 MED ORDER — ACETAMINOPHEN 500 MG PO TABS
1000.0000 mg | ORAL_TABLET | Freq: Once | ORAL | Status: AC
  Administered 2022-06-17: 1000 mg via ORAL
  Filled 2022-06-17: qty 2

## 2022-06-17 MED ORDER — HYDROMORPHONE HCL 1 MG/ML IJ SOLN: 0.2500 mg | INTRAMUSCULAR | Status: DC | PRN

## 2022-06-17 MED ORDER — CHLORHEXIDINE GLUCONATE 0.12 % MT SOLN: 15.0000 mL | Freq: Once | OROMUCOSAL | Status: AC

## 2022-06-17 MED ORDER — CEFAZOLIN SODIUM-DEXTROSE 2-4 GM/100ML-% IV SOLN
2.0000 g | INTRAVENOUS | Status: AC
  Administered 2022-06-17: 2 g via INTRAVENOUS

## 2022-06-17 MED ORDER — CELECOXIB 200 MG PO CAPS
200.0000 mg | ORAL_CAPSULE | Freq: Once | ORAL | Status: AC
  Administered 2022-06-17: 200 mg via ORAL
  Filled 2022-06-17: qty 1

## 2022-06-17 MED ORDER — DEXAMETHASONE SODIUM PHOSPHATE 10 MG/ML IJ SOLN
INTRAMUSCULAR | Status: DC | PRN
  Administered 2022-06-17: 5 mg

## 2022-06-17 MED ORDER — 0.9 % SODIUM CHLORIDE (POUR BTL) OPTIME
TOPICAL | Status: DC | PRN
  Administered 2022-06-17: 500 mL

## 2022-06-17 SURGICAL SUPPLY — 44 items
BAG COUNTER SPONGE SURGICOUNT (BAG) ×2 IMPLANT
BAG SPNG CNTER NS LX DISP (BAG) ×1
BLADE CLIPPER SURG (BLADE) IMPLANT
BNDG CMPR 9X4 STRL LF SNTH (GAUZE/BANDAGES/DRESSINGS)
BNDG ELASTIC 3X5.8 VLCR STR LF (GAUZE/BANDAGES/DRESSINGS) ×2 IMPLANT
BNDG ELASTIC 4X5.8 VLCR STR LF (GAUZE/BANDAGES/DRESSINGS) ×2 IMPLANT
BNDG ESMARK 4X9 LF (GAUZE/BANDAGES/DRESSINGS) IMPLANT
BNDG GAUZE DERMACEA FLUFF 4 (GAUZE/BANDAGES/DRESSINGS) ×2 IMPLANT
BNDG GZE DERMACEA 4 6PLY (GAUZE/BANDAGES/DRESSINGS) ×1
CORD BIPOLAR FORCEPS 12FT (ELECTRODE) IMPLANT
COVER SURGICAL LIGHT HANDLE (MISCELLANEOUS) ×2 IMPLANT
CUFF TOURN SGL QUICK 18X4 (TOURNIQUET CUFF) ×2 IMPLANT
CUFF TOURN SGL QUICK 24 (TOURNIQUET CUFF)
CUFF TRNQT CYL 24X4X16.5-23 (TOURNIQUET CUFF) IMPLANT
DRAPE OEC MINIVIEW 54X84 (DRAPES) ×2 IMPLANT
DRAPE SURG 17X23 STRL (DRAPES) ×2 IMPLANT
GAUZE SPONGE 4X4 12PLY STRL (GAUZE/BANDAGES/DRESSINGS) ×2 IMPLANT
GAUZE XEROFORM 1X8 LF (GAUZE/BANDAGES/DRESSINGS) ×2 IMPLANT
GLOVE SURG SYN 8.0 (GLOVE) ×1 IMPLANT
GLOVE SURG SYN 8.0 PF PI (GLOVE) ×2 IMPLANT
GOWN STRL REUS W/ TWL LRG LVL3 (GOWN DISPOSABLE) ×8 IMPLANT
GOWN STRL REUS W/ TWL XL LVL3 (GOWN DISPOSABLE) ×2 IMPLANT
GOWN STRL REUS W/TWL LRG LVL3 (GOWN DISPOSABLE) ×4
GOWN STRL REUS W/TWL XL LVL3 (GOWN DISPOSABLE) ×1
KIT BASIN OR (CUSTOM PROCEDURE TRAY) ×2 IMPLANT
KIT FRAME INST EXFX 2.0 (INSTRUMENTS) IMPLANT
KIT TURNOVER KIT B (KITS) ×2 IMPLANT
MANIFOLD NEPTUNE II (INSTRUMENTS) IMPLANT
NDL HYPO 25GX1X1/2 BEV (NEEDLE) IMPLANT
NEEDLE HYPO 25GX1X1/2 BEV (NEEDLE) IMPLANT
NS IRRIG 1000ML POUR BTL (IV SOLUTION) ×2 IMPLANT
PACK ORTHO EXTREMITY (CUSTOM PROCEDURE TRAY) ×2 IMPLANT
PAD ARMBOARD 7.5X6 YLW CONV (MISCELLANEOUS) ×2 IMPLANT
PAD CAST 4YDX4 CTTN HI CHSV (CAST SUPPLIES) ×2 IMPLANT
PADDING CAST COTTON 4X4 STRL (CAST SUPPLIES) ×1
SCREW BONE INFRAME 2.0X12 (Screw) IMPLANT
SCREW BONE INFRAME 2.0X24 (Screw) IMPLANT
SPONGE T-LAP 4X18 ~~LOC~~+RFID (SPONGE) IMPLANT
SUT ETHILON 4 0 PS 2 18 (SUTURE) IMPLANT
SYR CONTROL 10ML LL (SYRINGE) IMPLANT
TOWEL GREEN STERILE (TOWEL DISPOSABLE) ×2 IMPLANT
TOWEL GREEN STERILE FF (TOWEL DISPOSABLE) ×2 IMPLANT
TUBE CONNECTING 12X1/4 (SUCTIONS) IMPLANT
WATER STERILE IRR 1000ML POUR (IV SOLUTION) ×2 IMPLANT

## 2022-06-17 NOTE — Anesthesia Procedure Notes (Signed)
Procedure Name: MAC Date/Time: 06/17/2022 3:09 PM  Performed by: Barrington Ellison, CRNAPre-anesthesia Checklist: Patient identified, Emergency Drugs available, Suction available, Patient being monitored and Timeout performed Patient Re-evaluated:Patient Re-evaluated prior to induction Oxygen Delivery Method: Simple face mask

## 2022-06-17 NOTE — Op Note (Signed)
Patient was taken the operating suite and after induction of adequate regional anesthetic IV sedation the right upper extremity was prepped and draped in usual sterile fashion.  An Esmarch was used to exsanguinate the limb tourniquet was inflated 250 mmHg at this point time the fluoroscopic imaging a fracture of the base the proximal phalanx right small finger was reduced with longitudinal traction.  Once this was done we introduced the guidepin for the innate 2.0 intramedullary screw.  Fluoroscopic imaging was used to confirm reduction of the fracture good placement of the pin.  This was driven distally out the proximal phalanx head until the smaller caliber part of the guidepin was at the fracture site.  We then advanced a 24 mm x 2 mm screw from possible distal across the fracture site and cross fashion under fluoroscopic imaging.  The guidepin was then removed and a second 12 mm screw was placed from the radial side proximal to the ulnar side distal using the same technique.  Fluoroscopic imaging in multiple views revealed reduction of the fracture and good placement of the hardware.  We then dressed with Xeroform, 4 x 4's, and an ulnar gutter splint.  The patient tolerated this procedure well went recovery in stable fashion.

## 2022-06-17 NOTE — Transfer of Care (Signed)
Immediate Anesthesia Transfer of Care Note  Patient: Jeffery Jennings  Procedure(s) Performed: closed reduction percutaneous pinning right small finger proximal phalanx fracture (Right)  Patient Location: PACU  Anesthesia Type:MAC and Regional  Level of Consciousness: drowsy and patient cooperative  Airway & Oxygen Therapy: Patient Spontanous Breathing  Post-op Assessment: Report given to RN  Post vital signs: Reviewed and stable  Last Vitals:  Vitals Value Taken Time  BP 114/68 06/17/22 1549  Temp    Pulse 73 06/17/22 1551  Resp 17 06/17/22 1551  SpO2 96 % 06/17/22 1551  Vitals shown include unvalidated device data.  Last Pain:  Vitals:   06/17/22 1322  TempSrc: Oral  PainSc: 2          Complications: No notable events documented.

## 2022-06-17 NOTE — Brief Op Note (Signed)
06/17/2022  3:43 PM  PATIENT:  Jeffery Jennings  21 y.o. male  PRE-OPERATIVE DIAGNOSIS:  right small finger proximal phalanx fracture  POST-OPERATIVE DIAGNOSIS:  right small finger proximal phalanx fracture  PROCEDURE:  Procedure(s): closed reduction percutaneous pinning right small finger proximal phalanx fracture (Right)  SURGEON:  Surgeon(s) and Role:    Dairl Ponder, MD - Primary  PHYSICIAN ASSISTANT:   ASSISTANTS: none   ANESTHESIA:   regional  EBL: None  BLOOD ADMINISTERED:none  DRAINS: none   LOCAL MEDICATIONS USED:  NONE  SPECIMEN:  No Specimen  DISPOSITION OF SPECIMEN:  N/A  COUNTS:  YES  TOURNIQUET:   Total Tourniquet Time Documented: Upper Arm (Right) - 21 minutes Total: Upper Arm (Right) - 21 minutes   DICTATION: .Reubin Milan Dictation  PLAN OF CARE: Discharge to home after PACU  PATIENT DISPOSITION:  PACU - hemodynamically stable.   Delay start of Pharmacological VTE agent (>24hrs) due to surgical blood loss or risk of bleeding: not applicable

## 2022-06-17 NOTE — Anesthesia Procedure Notes (Signed)
Anesthesia Regional Block: Supraclavicular block   Pre-Anesthetic Checklist: , timeout performed,  Correct Patient, Correct Site, Correct Laterality,  Correct Procedure, Correct Position, site marked,  Risks and benefits discussed,  Surgical consent,  Pre-op evaluation,  At surgeon's request and post-op pain management  Laterality: Right  Prep: chloraprep       Needles:  Injection technique: Single-shot  Needle Type: Echogenic Stimulator Needle     Needle Length: 5cm  Needle Gauge: 22     Additional Needles:   Narrative:  Start time: 06/17/2022 2:11 PM End time: 06/17/2022 2:21 PM Injection made incrementally with aspirations every 5 mL.  Performed by: Personally  Anesthesiologist: Heather Roberts, MD  Additional Notes: Functioning IV was confirmed and monitors applied.  A 75mm 22ga echogenic arrow stimulator was used. Sterile prep and drape,hand hygiene and sterile gloves were used.Ultrasound guidance: relevant anatomy identified, needle position confirmed, local anesthetic spread visualized around nerve(s)., vascular puncture avoided.  Image printed for medical record.  Negative aspiration and negative test dose prior to incremental administration of local anesthetic. The patient tolerated the procedure well.

## 2022-06-17 NOTE — Anesthesia Preprocedure Evaluation (Addendum)
Anesthesia Evaluation  Patient identified by MRN, date of birth, ID band Patient awake    Reviewed: Allergy & Precautions, NPO status , Patient's Chart, lab work & pertinent test results  History of Anesthesia Complications Negative for: history of anesthetic complications  Airway Mallampati: II  TM Distance: >3 FB Neck ROM: Full    Dental no notable dental hx. (+) Dental Advisory Given   Pulmonary neg pulmonary ROS   Pulmonary exam normal        Cardiovascular negative cardio ROS Normal cardiovascular exam     Neuro/Psych  PSYCHIATRIC DISORDERS      negative neurological ROS     GI/Hepatic negative GI ROS, Neg liver ROS,,,  Endo/Other  negative endocrine ROS    Renal/GU negative Renal ROS     Musculoskeletal negative musculoskeletal ROS (+)    Abdominal   Peds  Hematology negative hematology ROS (+)   Anesthesia Other Findings   Reproductive/Obstetrics                             Anesthesia Physical Anesthesia Plan  ASA: 2  Anesthesia Plan: MAC and Regional   Post-op Pain Management: Tylenol PO (pre-op)*, Celebrex PO (pre-op)* and Regional block*   Induction: Intravenous  PONV Risk Score and Plan: 1 and Ondansetron and Propofol infusion  Airway Management Planned: Natural Airway  Additional Equipment:   Intra-op Plan:   Post-operative Plan: Extubation in OR  Informed Consent: I have reviewed the patients History and Physical, chart, labs and discussed the procedure including the risks, benefits and alternatives for the proposed anesthesia with the patient or authorized representative who has indicated his/her understanding and acceptance.     Dental advisory given  Plan Discussed with: Anesthesiologist, CRNA and Surgeon  Anesthesia Plan Comments:        Anesthesia Quick Evaluation

## 2022-06-17 NOTE — Anesthesia Postprocedure Evaluation (Signed)
Anesthesia Post Note  Patient: Jeffery Jennings  Procedure(s) Performed: closed reduction percutaneous pinning right small finger proximal phalanx fracture (Right)     Patient location during evaluation: PACU Anesthesia Type: Regional and MAC Level of consciousness: awake and alert Pain management: pain level controlled Vital Signs Assessment: post-procedure vital signs reviewed and stable Respiratory status: spontaneous breathing and respiratory function stable Cardiovascular status: stable Postop Assessment: no apparent nausea or vomiting Anesthetic complications: no   No notable events documented.  Last Vitals:  Vitals:   06/17/22 1600 06/17/22 1615  BP: 120/67 126/76  Pulse: 70 67  Resp: 12 11  Temp:  36.7 C  SpO2: 98% 100%    Last Pain:  Vitals:   06/17/22 1615  TempSrc:   PainSc: 0-No pain                 Tyquasia Pant DANIEL

## 2022-06-17 NOTE — H&P (Signed)
Jeffery Jennings is an 21 y.o. male.   Chief Complaint: Right small finger pain and deformity status post trauma HPI: 21 year old right-hand-dominant male with displaced angulated right small proximal phalanx base fracture  Past Medical History:  Diagnosis Date   ADHD (attention deficit hyperactivity disorder)     Past Surgical History:  Procedure Laterality Date   NO PAST SURGERIES      Family History  Problem Relation Age of Onset   Hypertension Father    Social History:  reports that he has never smoked. He does not have any smokeless tobacco history on file. He reports current alcohol use. He reports that he does not currently use drugs.  Allergies: No Known Allergies  Medications Prior to Admission  Medication Sig Dispense Refill   amphetamine-dextroamphetamine (ADDERALL) 20 MG tablet Take 2 tablets (40 mg total) by mouth daily. 60 tablet 0    No results found for this or any previous visit (from the past 48 hour(s)). No results found.  Review of Systems  All other systems reviewed and are negative.   Blood pressure 130/86, pulse 82, temperature 98.4 F (36.9 C), temperature source Oral, resp. rate 20, height 5\' 8"  (1.727 m), weight 75.8 kg, SpO2 98 %. Physical Exam Constitutional:      Appearance: Normal appearance.  HENT:     Head: Normocephalic and atraumatic.  Eyes:     Pupils: Pupils are equal, round, and reactive to light.  Cardiovascular:     Rate and Rhythm: Normal rate.  Pulmonary:     Effort: Pulmonary effort is normal.  Musculoskeletal:     Right hand: Tenderness and bony tenderness present. Decreased range of motion.     Cervical back: Normal range of motion.     Comments: Right small finger proximal phalanx fracture with angular deformity  Skin:    General: Skin is warm.  Neurological:     General: No focal deficit present.     Mental Status: He is alert and oriented to person, place, and time.  Psychiatric:        Mood and Affect: Mood  normal.        Behavior: Behavior normal.        Thought Content: Thought content normal.        Judgment: Judgment normal.      Assessment/Plan 21 year old male right-hand-dominant displaced proximal phalanx fracture dominant right small finger with angular deformity and loss of function.  I discussed the role of pinning of this fracture with intramedullary fixation as an outpatient.  Patient understands risks and benefits and wishes to proceed  36, MD 06/17/2022, 1:48 PM

## 2022-06-18 ENCOUNTER — Encounter (HOSPITAL_COMMUNITY): Payer: Self-pay | Admitting: Orthopedic Surgery

## 2022-07-13 ENCOUNTER — Other Ambulatory Visit (HOSPITAL_BASED_OUTPATIENT_CLINIC_OR_DEPARTMENT_OTHER): Payer: Self-pay | Admitting: Family Medicine

## 2022-07-13 DIAGNOSIS — F909 Attention-deficit hyperactivity disorder, unspecified type: Secondary | ICD-10-CM

## 2022-07-13 MED ORDER — AMPHETAMINE-DEXTROAMPHETAMINE 20 MG PO TABS: 40.0000 mg | ORAL_TABLET | Freq: Every day | ORAL | 0 refills | Status: DC

## 2022-07-23 ENCOUNTER — Ambulatory Visit (INDEPENDENT_AMBULATORY_CARE_PROVIDER_SITE_OTHER): Payer: 59 | Admitting: Family Medicine

## 2022-07-23 ENCOUNTER — Encounter (HOSPITAL_BASED_OUTPATIENT_CLINIC_OR_DEPARTMENT_OTHER): Payer: Self-pay | Admitting: Family Medicine

## 2022-07-23 VITALS — BP 122/78 | HR 113 | Ht 68.0 in | Wt 172.6 lb

## 2022-07-23 DIAGNOSIS — F909 Attention-deficit hyperactivity disorder, unspecified type: Secondary | ICD-10-CM | POA: Diagnosis not present

## 2022-07-23 NOTE — Progress Notes (Signed)
    Procedures performed today:    None.  Independent interpretation of notes and tests performed by another provider:   None.  Brief History, Exam, Impression, and Recommendations:    BP 122/78 (BP Location: Left Arm, Patient Position: Sitting, Cuff Size: Large)   Pulse (!) 113   Ht 5\' 8"  (1.727 m)   Wt 172 lb 9.6 oz (78.3 kg)   SpO2 100%   BMI 26.24 kg/m   ADHD (attention deficit hyperactivity disorder) Patient presents for follow-up of ADHD management.  He continues with Adderall, has not had any recent dose change.  Feels that symptoms have been well-controlled with this same dose.  Denies any issues with chest pain, palpitations, no appetite concerns, sleeping well at night.  He continues with same employment at [employer]. On exam, vital signs stable, patient is in no acute distress.  Cardiovascular exam with regular rate and rhythm, no murmur appreciated.  Lungs clear to auscultation bilaterally. PDMP reviewed, no red flags.  Patient does not need refill of medications today.  We will plan to continue with current regimen, can provide refill of medication when this comes to next month Plan for follow-up in about 3 months or sooner as needed  Return in about 3 months (around 10/22/2022).   ___________________________________________ Jahmai Finelli de 10/24/2022, MD, ABFM, CAQSM Primary Care and Sports Medicine [facility]

## 2022-07-23 NOTE — Assessment & Plan Note (Signed)
Patient presents for follow-up of ADHD management.  He continues with Adderall, has not had any recent dose change.  Feels that symptoms have been well-controlled with this same dose.  Denies any issues with chest pain, palpitations, no appetite concerns, sleeping well at night.  He continues with same employment at [employer]. On exam, vital signs stable, patient is in no acute distress.  Cardiovascular exam with regular rate and rhythm, no murmur appreciated.  Lungs clear to auscultation bilaterally. PDMP reviewed, no red flags.  Patient does not need refill of medications today.  We will plan to continue with current regimen, can provide refill of medication when this comes to next month Plan for follow-up in about 3 months or sooner as needed

## 2022-08-19 ENCOUNTER — Other Ambulatory Visit (HOSPITAL_BASED_OUTPATIENT_CLINIC_OR_DEPARTMENT_OTHER): Payer: Self-pay | Admitting: Family Medicine

## 2022-08-19 DIAGNOSIS — F909 Attention-deficit hyperactivity disorder, unspecified type: Secondary | ICD-10-CM

## 2022-08-21 MED ORDER — AMPHETAMINE-DEXTROAMPHETAMINE 20 MG PO TABS: 40.0000 mg | ORAL_TABLET | Freq: Every day | ORAL | 0 refills | Status: DC

## 2022-09-30 ENCOUNTER — Other Ambulatory Visit (HOSPITAL_BASED_OUTPATIENT_CLINIC_OR_DEPARTMENT_OTHER): Payer: Self-pay | Admitting: Family Medicine

## 2022-09-30 DIAGNOSIS — F909 Attention-deficit hyperactivity disorder, unspecified type: Secondary | ICD-10-CM

## 2022-10-01 MED ORDER — AMPHETAMINE-DEXTROAMPHETAMINE 20 MG PO TABS: 40.0000 mg | ORAL_TABLET | Freq: Every day | ORAL | 0 refills | Status: DC

## 2022-10-22 ENCOUNTER — Encounter (HOSPITAL_BASED_OUTPATIENT_CLINIC_OR_DEPARTMENT_OTHER): Payer: Self-pay | Admitting: Family Medicine

## 2022-10-22 ENCOUNTER — Ambulatory Visit (INDEPENDENT_AMBULATORY_CARE_PROVIDER_SITE_OTHER): Payer: Managed Care, Other (non HMO) | Admitting: Family Medicine

## 2022-10-22 VITALS — BP 106/70 | HR 79 | Ht 68.0 in | Wt 166.2 lb

## 2022-10-22 DIAGNOSIS — F909 Attention-deficit hyperactivity disorder, unspecified type: Secondary | ICD-10-CM

## 2022-10-22 MED ORDER — AMPHETAMINE-DEXTROAMPHETAMINE 10 MG PO TABS: 10.0000 mg | ORAL_TABLET | Freq: Every day | ORAL | 0 refills | Status: DC

## 2022-10-22 NOTE — Assessment & Plan Note (Addendum)
Patient continues with Adderall 40 mg once daily.  Reports that over the past week or so, he has felt that medication has been less effective at controlling symptoms as compared to previously.  Primarily sites that focus has been somewhat worsened/not controlled as well recently.  He denies any issues with sleep, no chest pain, palpitations, appetite concerns. Discussed options today.  Given that symptoms have become more bothersome with decreased effectiveness of current dose of Adderall, would be reasonable to slightly titrate dose.  Did discuss that oftentimes doses of Adderall above 40 mg daily may not be more effective than 40 mg dose.  Recommend continue to monitor for side effects as increasing dose can increase potential for side effects. If new dose of Adderall does not adequately control symptoms, we can continue with this moving forward.  If symptoms continue to be an adequately controlled, can consider increasing Adderall dose to 60 mg which would be the maximum dose for medication.  Alternatively, could consider switching to alternative stimulant medication.  Previously was prescribed Vyvanse, however this was cost prohibitive at that time Plan for follow-up in 1 month to assess progress with change in Adderall dose

## 2022-10-22 NOTE — Progress Notes (Signed)
    Procedures performed today:    None.  Independent interpretation of notes and tests performed by another provider:   None.  Brief History, Exam, Impression, and Recommendations:    BP 106/70 (BP Location: Left Arm, Patient Position: Sitting, Cuff Size: Normal)   Pulse 79   Ht 5\' 8"  (1.727 m)   Wt 166 lb 3.2 oz (75.4 kg)   SpO2 100%   BMI 25.27 kg/m   ADHD (attention deficit hyperactivity disorder) Patient continues with Adderall 40 mg once daily.  Reports that over the past week or so, he has felt that medication has been less effective at controlling symptoms as compared to previously.  Primarily sites that focus has been somewhat worsened/not controlled as well recently.  He denies any issues with sleep, no chest pain, palpitations, appetite concerns. Discussed options today.  Given that symptoms have become more bothersome with decreased effectiveness of current dose of Adderall, would be reasonable to slightly titrate dose.  Did discuss that oftentimes doses of Adderall above 40 mg daily may not be more effective than 40 mg dose.  Recommend continue to monitor for side effects as increasing dose can increase potential for side effects. If new dose of Adderall does not adequately control symptoms, we can continue with this moving forward.  If symptoms continue to be an adequately controlled, can consider increasing Adderall dose to 60 mg which would be the maximum dose for medication.  Alternatively, could consider switching to alternative stimulant medication.  Previously was prescribed Vyvanse, however this was cost prohibitive at that time Plan for follow-up in 1 month to assess progress with change in Adderall dose  Return in about 4 weeks (around 11/19/2022) for med check.   ___________________________________________ Camdyn Beske de [place], MD, ABFM, [facility] [place]

## 2022-11-05 ENCOUNTER — Other Ambulatory Visit (HOSPITAL_BASED_OUTPATIENT_CLINIC_OR_DEPARTMENT_OTHER): Payer: Self-pay | Admitting: Family Medicine

## 2022-11-05 ENCOUNTER — Encounter (HOSPITAL_BASED_OUTPATIENT_CLINIC_OR_DEPARTMENT_OTHER): Payer: Self-pay | Admitting: Family Medicine

## 2022-11-05 DIAGNOSIS — F909 Attention-deficit hyperactivity disorder, unspecified type: Secondary | ICD-10-CM

## 2022-11-06 MED ORDER — AMPHETAMINE-DEXTROAMPHETAMINE 20 MG PO TABS: 40.0000 mg | ORAL_TABLET | Freq: Every day | ORAL | 0 refills | Status: DC

## 2022-11-10 ENCOUNTER — Other Ambulatory Visit (HOSPITAL_BASED_OUTPATIENT_CLINIC_OR_DEPARTMENT_OTHER): Payer: Self-pay | Admitting: Family Medicine

## 2022-11-10 MED ORDER — AMPHETAMINE-DEXTROAMPHETAMINE 10 MG PO TABS: 10.0000 mg | ORAL_TABLET | Freq: Every day | ORAL | 0 refills | Status: DC

## 2022-11-23 ENCOUNTER — Encounter (HOSPITAL_BASED_OUTPATIENT_CLINIC_OR_DEPARTMENT_OTHER): Payer: Self-pay

## 2022-11-23 ENCOUNTER — Ambulatory Visit (INDEPENDENT_AMBULATORY_CARE_PROVIDER_SITE_OTHER): Payer: Managed Care, Other (non HMO) | Admitting: Family Medicine

## 2022-11-23 ENCOUNTER — Encounter (HOSPITAL_BASED_OUTPATIENT_CLINIC_OR_DEPARTMENT_OTHER): Payer: Self-pay | Admitting: Family Medicine

## 2022-11-29 NOTE — Progress Notes (Signed)
Patient was not seen.

## 2022-12-08 ENCOUNTER — Other Ambulatory Visit (HOSPITAL_BASED_OUTPATIENT_CLINIC_OR_DEPARTMENT_OTHER): Payer: Self-pay | Admitting: Family Medicine

## 2022-12-08 DIAGNOSIS — F909 Attention-deficit hyperactivity disorder, unspecified type: Secondary | ICD-10-CM

## 2022-12-09 MED ORDER — AMPHETAMINE-DEXTROAMPHETAMINE 20 MG PO TABS: 40.0000 mg | ORAL_TABLET | Freq: Every day | ORAL | 0 refills | Status: DC

## 2022-12-09 MED ORDER — AMPHETAMINE-DEXTROAMPHETAMINE 10 MG PO TABS: 10.0000 mg | ORAL_TABLET | Freq: Every day | ORAL | 0 refills | Status: DC

## 2023-01-18 ENCOUNTER — Other Ambulatory Visit (HOSPITAL_BASED_OUTPATIENT_CLINIC_OR_DEPARTMENT_OTHER): Payer: Self-pay | Admitting: Family Medicine

## 2023-01-18 DIAGNOSIS — F909 Attention-deficit hyperactivity disorder, unspecified type: Secondary | ICD-10-CM

## 2023-01-19 MED ORDER — AMPHETAMINE-DEXTROAMPHETAMINE 20 MG PO TABS: 40.0000 mg | ORAL_TABLET | Freq: Every day | ORAL | 0 refills | Status: DC

## 2023-01-19 MED ORDER — AMPHETAMINE-DEXTROAMPHETAMINE 10 MG PO TABS: 10.0000 mg | ORAL_TABLET | Freq: Every day | ORAL | 0 refills | Status: DC

## 2023-01-29 ENCOUNTER — Ambulatory Visit (INDEPENDENT_AMBULATORY_CARE_PROVIDER_SITE_OTHER): Payer: Managed Care, Other (non HMO) | Admitting: Family Medicine

## 2023-01-29 ENCOUNTER — Encounter (HOSPITAL_BASED_OUTPATIENT_CLINIC_OR_DEPARTMENT_OTHER): Payer: Self-pay | Admitting: Family Medicine

## 2023-01-29 VITALS — BP 135/85 | HR 80 | Ht 68.0 in | Wt 161.7 lb

## 2023-01-29 DIAGNOSIS — H6122 Impacted cerumen, left ear: Secondary | ICD-10-CM

## 2023-01-29 NOTE — Progress Notes (Signed)
   Acute Office Visit  Subjective:     Patient ID: Jeffery Jennings, male    DOB: April 23, 2001, 22 y.o.   MRN: [id number]  Chief Complaint  Patient presents with   Ear Fullness    Right side, clogged for about a week, has tried peroxide   Jeffery Jennings is a 22 year-old male patient who presents today for right sided ear fullness. Hydrogen peroxide overnight multiple times. Reports decreased hearing loss. This started about a week ago after swimming and getting water in his ears.   Review of Systems  Constitutional:  Negative for chills and fever.  HENT:  Positive for hearing loss. Negative for congestion, ear discharge, ear pain, sinus pain, sore throat and tinnitus.   Eyes:  Negative for pain.  Respiratory:  Negative for shortness of breath.   Cardiovascular:  Negative for chest pain.  Neurological:  Negative for dizziness and headaches.  Psychiatric/Behavioral:  Negative for depression. The patient is not nervous/anxious.       Objective:    BP 135/85   Pulse 80   Ht 5\' 8"  (1.727 m)   Wt 161 lb 11.2 oz (73.3 kg)   SpO2 100%   BMI 24.59 kg/m   Physical Exam Constitutional:      Appearance: Normal appearance.  HENT:     Right Ear: There is impacted cerumen.     Left Ear: Tympanic membrane and ear canal normal.     Nose: Nose normal. No congestion.     Mouth/Throat:     Mouth: Mucous membranes are moist.     Pharynx: Oropharynx is clear.  Cardiovascular:     Rate and Rhythm: Normal rate and regular rhythm.     Pulses: Normal pulses.     Heart sounds: Normal heart sounds.  Pulmonary:     Effort: Pulmonary effort is normal.     Breath sounds: Normal breath sounds.  Neurological:     Mental Status: He is alert.  Psychiatric:        Mood and Affect: Mood normal.        Behavior: Behavior normal.      Assessment & Plan:  1. Impacted cerumen of left ear Patient presents today for right ear fullness present for the past week. He reports decreased hearing that has not  improved after multiple treatments with hydrogen peroxide solution. Denies ear pain, ear drainage, sinus pain/pressure, headache, sinus pain/pressure, congestion, or rhinorrhea. Physical exam remarkable for right ear canal with cerumen impaction. Ear lavage performed in right ear. Both ear canals are clear without cerumen impaction and discharge. After lavage completed, the left and right tympanic membranes are clearly visible- translucent, pearly grey, and shiny with no bulging or retraction. Normal in appearance with good cone-shaped light reflection of light and smooth consistency. Provided reassurance to patient that no infection, fluid or blood accumulation, swelling, or perforation is present. Advised patient if he notices a decrease in hearing in the future, he can utilize OTC Debrox solution. No indication for antibiotic regimen.     Return if symptoms worsen or fail to improve.  Alyson Reedy, FNP

## 2023-02-22 ENCOUNTER — Ambulatory Visit (INDEPENDENT_AMBULATORY_CARE_PROVIDER_SITE_OTHER): Payer: Managed Care, Other (non HMO) | Admitting: Family Medicine

## 2023-02-22 ENCOUNTER — Encounter (HOSPITAL_BASED_OUTPATIENT_CLINIC_OR_DEPARTMENT_OTHER): Payer: Self-pay | Admitting: Family Medicine

## 2023-02-22 VITALS — BP 125/82 | HR 95 | Ht 68.0 in | Wt 161.8 lb

## 2023-02-22 DIAGNOSIS — F909 Attention-deficit hyperactivity disorder, unspecified type: Secondary | ICD-10-CM | POA: Diagnosis not present

## 2023-02-22 MED ORDER — AMPHETAMINE-DEXTROAMPHETAMINE 20 MG PO TABS
40.0000 mg | ORAL_TABLET | Freq: Every day | ORAL | 0 refills | Status: DC
Start: 2023-02-22 — End: 2023-03-17

## 2023-02-22 MED ORDER — AMPHETAMINE-DEXTROAMPHETAMINE 10 MG PO TABS: 10.0000 mg | ORAL_TABLET | Freq: Every day | ORAL | 0 refills | Status: DC

## 2023-02-22 NOTE — Assessment & Plan Note (Signed)
Patient reports that he is doing well with current dose of Adderall.  At last appointment, we did slightly increase dose.  He does feel that this has been beneficial in regards to symptom control.  He denies any new issues related to sleep, no issues with chest pain, palpitations, appetite concerns. Given progress with dose of Adderall, can continue with this at this time.  It does appear that according to PDMP, he will be needing refill too soon, refill sent to pharmacy on file, no red flags on PDMP Plan for follow-up in about 3 months or sooner as needed

## 2023-02-22 NOTE — Progress Notes (Signed)
    Procedures performed today:    None.  Independent interpretation of notes and tests performed by another provider:   None.  Brief History, Exam, Impression, and Recommendations:    BP 125/82 (BP Location: Right Arm, Patient Position: Sitting, Cuff Size: Normal)   Pulse 95   Ht 5\' 8"  (1.727 m)   Wt 161 lb 12.8 oz (73.4 kg)   SpO2 100%   BMI 24.60 kg/m   Attention deficit hyperactivity disorder (ADHD), unspecified ADHD type Assessment & Plan: Patient reports that he is doing well with current dose of Adderall.  At last appointment, we did slightly increase dose.  He does feel that this has been beneficial in regards to symptom control.  He denies any new issues related to sleep, no issues with chest pain, palpitations, appetite concerns. Given progress with dose of Adderall, can continue with this at this time.  It does appear that according to PDMP, he will be needing refill too soon, refill sent to pharmacy on file, no red flags on PDMP Plan for follow-up in about 3 months or sooner as needed  Orders: -     Amphetamine-Dextroamphetamine; Take 2 tablets (40 mg total) by mouth daily.  Dispense: 60 tablet; Refill: 0  Other orders -     Amphetamine-Dextroamphetamine; Take 1 tablet (10 mg total) by mouth daily.  Dispense: 30 tablet; Refill: 0  Return in about 3 months (around 05/25/2023) for med check.   ___________________________________________ Kenny Rea de [place], MD, ABFM, CAQSM Primary Care and Sports Medicine [facility]

## 2023-03-17 ENCOUNTER — Other Ambulatory Visit (HOSPITAL_BASED_OUTPATIENT_CLINIC_OR_DEPARTMENT_OTHER): Payer: Self-pay | Admitting: Family Medicine

## 2023-03-17 DIAGNOSIS — F909 Attention-deficit hyperactivity disorder, unspecified type: Secondary | ICD-10-CM

## 2023-03-18 MED ORDER — AMPHETAMINE-DEXTROAMPHETAMINE 20 MG PO TABS: 40.0000 mg | ORAL_TABLET | Freq: Every day | ORAL | 0 refills | Status: DC

## 2023-03-18 MED ORDER — AMPHETAMINE-DEXTROAMPHETAMINE 10 MG PO TABS: 10.0000 mg | ORAL_TABLET | Freq: Every day | ORAL | 0 refills | Status: DC

## 2023-04-28 ENCOUNTER — Other Ambulatory Visit (HOSPITAL_BASED_OUTPATIENT_CLINIC_OR_DEPARTMENT_OTHER): Payer: Self-pay | Admitting: Family Medicine

## 2023-04-28 DIAGNOSIS — F909 Attention-deficit hyperactivity disorder, unspecified type: Secondary | ICD-10-CM

## 2023-04-29 MED ORDER — AMPHETAMINE-DEXTROAMPHETAMINE 10 MG PO TABS: 10.0000 mg | ORAL_TABLET | Freq: Every day | ORAL | 0 refills | Status: DC

## 2023-04-29 MED ORDER — AMPHETAMINE-DEXTROAMPHETAMINE 20 MG PO TABS: 40.0000 mg | ORAL_TABLET | Freq: Every day | ORAL | 0 refills | Status: DC

## 2023-04-30 ENCOUNTER — Encounter (HOSPITAL_BASED_OUTPATIENT_CLINIC_OR_DEPARTMENT_OTHER): Payer: Self-pay | Admitting: Family Medicine

## 2023-04-30 ENCOUNTER — Ambulatory Visit (HOSPITAL_BASED_OUTPATIENT_CLINIC_OR_DEPARTMENT_OTHER): Payer: Managed Care, Other (non HMO) | Admitting: Family Medicine

## 2023-04-30 VITALS — BP 139/81 | HR 114 | Ht 68.0 in | Wt 163.3 lb

## 2023-04-30 DIAGNOSIS — F909 Attention-deficit hyperactivity disorder, unspecified type: Secondary | ICD-10-CM | POA: Diagnosis not present

## 2023-04-30 DIAGNOSIS — F419 Anxiety disorder, unspecified: Secondary | ICD-10-CM | POA: Diagnosis not present

## 2023-04-30 MED ORDER — ESCITALOPRAM OXALATE 5 MG PO TABS: 5.0000 mg | ORAL_TABLET | Freq: Every day | ORAL | 1 refills | Status: DC

## 2023-04-30 NOTE — Assessment & Plan Note (Signed)
Patient reports that he is doing well with current dose of Adderall.  He denies any new issues related to sleep, no issues with chest pain, palpitations, appetite concerns. Given progress with dose of Adderall, can continue with this at this time.  Refill sent to pharmacy yesterday.

## 2023-04-30 NOTE — Progress Notes (Unsigned)
    Procedures performed today:    None.  Independent interpretation of notes and tests performed by another provider:   None.  Brief History, Exam, Impression, and Recommendations:    BP 139/81 (BP Location: Right Arm, Patient Position: Sitting, Cuff Size: Normal)   Pulse (!) 114   Ht 5\' 8"  (1.727 m)   Wt 163 lb 4.8 oz (74.1 kg)   SpO2 99%   BMI 24.83 kg/m   There are no diagnoses linked to this encounter.No follow-ups on file.   ___________________________________________ Jeffery Polio de [place], MD, ABFM, [facility] and Sports Medicine [facility]

## 2023-05-04 NOTE — Assessment & Plan Note (Signed)
Patient reports increase symptoms of anxiety.  He reports increased worry, feeling more anxious/on edge.  This has been more noticeable in recent months.  Has been impacting day-to-day life.  Will have some difficulty with sleep at times as worrying may keep him from being able to rest.  Screening today with PHQ-9 score of 4 and GAD-7 score of 10. We discussed considerations today.  Patient would be interested in proceeding with pharmacotherapy given impact of symptoms.  We will start with low-dose of escitalopram, discussed potential side effects and adverse reactions.  We will plan for follow-up in about 2 weeks to assess progress with medication and determine at that time if we we will continue with medication, titrate dose of medication if needed.

## 2023-05-13 ENCOUNTER — Encounter (HOSPITAL_BASED_OUTPATIENT_CLINIC_OR_DEPARTMENT_OTHER): Payer: Self-pay | Admitting: Family Medicine

## 2023-05-13 ENCOUNTER — Telehealth (HOSPITAL_BASED_OUTPATIENT_CLINIC_OR_DEPARTMENT_OTHER): Payer: Managed Care, Other (non HMO) | Admitting: Family Medicine

## 2023-05-13 DIAGNOSIS — F419 Anxiety disorder, unspecified: Secondary | ICD-10-CM | POA: Diagnosis not present

## 2023-05-13 NOTE — Progress Notes (Signed)
   Virtual Visit   I connected with  Jeffery Jennings  on 05/13/23 by telephone/telehealth and verified that I am speaking with the correct person using two identifiers. Visit completed via video.  I discussed the limitations, risks, security and privacy concerns of performing an evaluation and management service by telephone, including the higher likelihood of inaccurate diagnosis and treatment, and the availability of in person appointments.  We also discussed the likely need of an additional face to face encounter for complete and high quality delivery of care.  I also discussed with the patient that there may be a patient responsible charge related to this service. The patient expressed understanding and wishes to proceed.  Provider location is in medical facility. Patient location is at their home, different from provider location. People involved in care of the patient during this telehealth encounter were myself, my nurse/medical assistant, and my front office/scheduling team member.  Review of Systems: No fevers, chills, night sweats, weight loss, chest pain, or shortness of breath.   Objective Findings:    General: Speaking full sentences, no audible heavy breathing.  Sounds alert and appropriately interactive.    Independent interpretation of tests performed by another provider:   None.  Brief History, Exam, Impression, and Recommendations:    Anxiety Patient reports that he has been doing well with escitalopram.  Has not noticed any side effects, feels that he has been tolerating medication well.  He has noted improvement in symptoms, reports that symptoms are about 75 to 80% controlled at this point. We discussed considerations related to medication management.  After discussion, patient elected to continue with current dose of escitalopram.  We will plan to follow-up in about 1 month to assess progress and determine if we will continue with 5 mg dose moving forward or if we feel  that this titrating to 10 mg dose would be prudent at that time.  I discussed the above assessment and treatment plan with the patient. The patient was provided an opportunity to ask questions and all were answered. The patient agreed with the plan and demonstrated an understanding of the instructions.  The patient was advised to call back or seek an in-person evaluation if the symptoms worsen or if the condition fails to improve as anticipated.  I provided 12 minutes of face to face and non-face-to-face time during this encounter date, time was needed to gather information, review chart, records, communicate/coordinate with staff remotely, as well as complete documentation.   ___________________________________________ Sameerah Nachtigal de [place], MD, ABFM, CAQSM Primary Care and Sports Medicine [facility]

## 2023-05-13 NOTE — Assessment & Plan Note (Signed)
Patient reports that he has been doing well with escitalopram.  Has not noticed any side effects, feels that he has been tolerating medication well.  He has noted improvement in symptoms, reports that symptoms are about 75 to 80% controlled at this point. We discussed considerations related to medication management.  After discussion, patient elected to continue with current dose of escitalopram.  We will plan to follow-up in about 1 month to assess progress and determine if we will continue with 5 mg dose moving forward or if we feel that this titrating to 10 mg dose would be prudent at that time.

## 2023-05-26 ENCOUNTER — Ambulatory Visit (INDEPENDENT_AMBULATORY_CARE_PROVIDER_SITE_OTHER): Payer: Managed Care, Other (non HMO) | Admitting: Family Medicine

## 2023-05-26 ENCOUNTER — Encounter (HOSPITAL_BASED_OUTPATIENT_CLINIC_OR_DEPARTMENT_OTHER): Payer: Self-pay | Admitting: Family Medicine

## 2023-05-26 VITALS — BP 126/72 | HR 84 | Ht 68.0 in | Wt 166.5 lb

## 2023-05-26 DIAGNOSIS — F419 Anxiety disorder, unspecified: Secondary | ICD-10-CM | POA: Diagnosis not present

## 2023-05-26 MED ORDER — ESCITALOPRAM OXALATE 5 MG PO TABS: 5.0000 mg | ORAL_TABLET | Freq: Every day | ORAL | 1 refills | Status: DC

## 2023-05-26 NOTE — Assessment & Plan Note (Signed)
Patient reports that he has been doing well with escitalopram.  Has not noticed any side effects, feels that he has been tolerating medication well.  He has noted improvement in symptoms. We discussed considerations related to medication management.  After discussion, patient elected to continue with current dose of escitalopram.  We will plan to follow-up in about 3 months to assess progress or sooner as needed

## 2023-05-26 NOTE — Progress Notes (Signed)
    Procedures performed today:    None.  Independent interpretation of notes and tests performed by another provider:   None.  Brief History, Exam, Impression, and Recommendations:    BP 126/72 (BP Location: Right Arm, Patient Position: Sitting, Cuff Size: Normal)   Pulse 84   Ht 5\' 8"  (1.727 m)   Wt 166 lb 8 oz (75.5 kg)   SpO2 98%   BMI 25.32 kg/m   Anxiety Assessment & Plan: Patient reports that he has been doing well with escitalopram.  Has not noticed any side effects, feels that he has been tolerating medication well.  He has noted improvement in symptoms. We discussed considerations related to medication management.  After discussion, patient elected to continue with current dose of escitalopram.  We will plan to follow-up in about 3 months to assess progress or sooner as needed   Other orders -     Escitalopram Oxalate; Take 1 tablet (5 mg total) by mouth daily.  Dispense: 90 tablet; Refill: 1  Return in about 3 months (around 08/26/2023) for med check.   ___________________________________________ Trinity Haun de [place], MD, ABFM, CAQSM Primary Care and Sports Medicine [facility]

## 2023-06-03 ENCOUNTER — Other Ambulatory Visit (HOSPITAL_BASED_OUTPATIENT_CLINIC_OR_DEPARTMENT_OTHER): Payer: Self-pay | Admitting: Family Medicine

## 2023-06-03 DIAGNOSIS — F909 Attention-deficit hyperactivity disorder, unspecified type: Secondary | ICD-10-CM

## 2023-06-03 MED ORDER — AMPHETAMINE-DEXTROAMPHETAMINE 20 MG PO TABS: 40.0000 mg | ORAL_TABLET | Freq: Every day | ORAL | 0 refills | Status: DC

## 2023-06-03 MED ORDER — AMPHETAMINE-DEXTROAMPHETAMINE 10 MG PO TABS: 10.0000 mg | ORAL_TABLET | Freq: Every day | ORAL | 0 refills | Status: DC

## 2023-06-14 ENCOUNTER — Ambulatory Visit (HOSPITAL_BASED_OUTPATIENT_CLINIC_OR_DEPARTMENT_OTHER): Payer: Managed Care, Other (non HMO) | Admitting: Family Medicine

## 2023-06-24 ENCOUNTER — Encounter (HOSPITAL_BASED_OUTPATIENT_CLINIC_OR_DEPARTMENT_OTHER): Payer: Self-pay | Admitting: Family Medicine

## 2023-07-01 ENCOUNTER — Other Ambulatory Visit (HOSPITAL_BASED_OUTPATIENT_CLINIC_OR_DEPARTMENT_OTHER): Payer: Self-pay | Admitting: Family Medicine

## 2023-07-01 DIAGNOSIS — F909 Attention-deficit hyperactivity disorder, unspecified type: Secondary | ICD-10-CM

## 2023-07-07 MED ORDER — AMPHETAMINE-DEXTROAMPHETAMINE 20 MG PO TABS: 40.0000 mg | ORAL_TABLET | Freq: Every day | ORAL | 0 refills | Status: DC

## 2023-07-07 MED ORDER — AMPHETAMINE-DEXTROAMPHETAMINE 10 MG PO TABS: 10.0000 mg | ORAL_TABLET | Freq: Every day | ORAL | 0 refills | Status: DC

## 2023-08-24 ENCOUNTER — Other Ambulatory Visit (HOSPITAL_BASED_OUTPATIENT_CLINIC_OR_DEPARTMENT_OTHER): Payer: Self-pay | Admitting: Family Medicine

## 2023-08-24 DIAGNOSIS — F909 Attention-deficit hyperactivity disorder, unspecified type: Secondary | ICD-10-CM

## 2023-08-30 ENCOUNTER — Ambulatory Visit (INDEPENDENT_AMBULATORY_CARE_PROVIDER_SITE_OTHER): Payer: Self-pay | Admitting: Family Medicine

## 2023-08-30 ENCOUNTER — Encounter (HOSPITAL_BASED_OUTPATIENT_CLINIC_OR_DEPARTMENT_OTHER): Payer: Self-pay | Admitting: Family Medicine

## 2023-08-30 VITALS — BP 108/78 | HR 72 | Temp 97.5°F | Ht 68.0 in | Wt 174.0 lb

## 2023-08-30 DIAGNOSIS — F419 Anxiety disorder, unspecified: Secondary | ICD-10-CM

## 2023-08-30 DIAGNOSIS — F909 Attention-deficit hyperactivity disorder, unspecified type: Secondary | ICD-10-CM

## 2023-08-30 DIAGNOSIS — Z0000 Encounter for general adult medical examination without abnormal findings: Secondary | ICD-10-CM

## 2023-08-30 MED ORDER — AMPHETAMINE-DEXTROAMPHETAMINE 10 MG PO TABS: 10.0000 mg | ORAL_TABLET | Freq: Every day | ORAL | 0 refills | Status: DC

## 2023-08-30 MED ORDER — AMPHETAMINE-DEXTROAMPHETAMINE 20 MG PO TABS: 40.0000 mg | ORAL_TABLET | Freq: Every day | ORAL | 0 refills | Status: DC

## 2023-08-30 NOTE — Assessment & Plan Note (Signed)
Patient reports that he is doing well with current dose of Adderall.  He denies any new issues related to sleep, no issues with chest pain, palpitations, appetite concerns. Given progress with dose of Adderall, can continue with this at this time.  Refill sent to pharmacy earlier today.

## 2023-08-30 NOTE — Patient Instructions (Signed)
Medication Instructions:  Your physician recommends that you continue on your current medications as directed. Please refer to the Current Medication list given to you today. --If you need a refill on any your medications before your next appointment, please call your pharmacy first. If no refills are authorized on file call the office.-- Lab Work: Your physician has recommended that you have lab work today: 1 week before next visit  If you have labs (blood work) drawn today and your tests are completely normal, you will receive your results via MyChart message OR a phone call from our staff.  Please ensure you check your voicemail in the event that you authorized detailed messages to be left on a delegated number. If you have any lab test that is abnormal or we need to change your treatment, we will call you to review the results.   Follow-Up: Your next appointment:   Your physician recommends that you schedule a follow-up appointment in: 3 months physical with Dr. de Peru  You will receive a text message or e-mail with a link to a survey about your care and experience with [place] today! We would greatly appreciate your feedback!   Thanks for letting us be apart of your health journey!!  Primary Care and Sports Medicine   Dr. Ceasar Mons Peru   We encourage you to activate your patient portal called "MyChart".  Sign up information is provided on this After Visit Summary.  MyChart is used to connect with patients for Virtual Visits (Telemedicine).  Patients are able to view lab/test results, encounter notes, upcoming appointments, etc.  Non-urgent messages can be sent to your provider as well. To learn more about what you can do with MyChart, please visit --  [email or web address].

## 2023-08-30 NOTE — Assessment & Plan Note (Signed)
Patient reports that he has been doing well with escitalopram.  Has not noticed any side effects, feels that he has been tolerating medication well.  He has noted improvement in symptoms. Can continue with current dose of medication, no changes today

## 2023-08-30 NOTE — Progress Notes (Signed)
    Procedures performed today:    None.  Independent interpretation of notes and tests performed by another provider:   None.  Brief History, Exam, Impression, and Recommendations:    BP 108/78 (BP Location: Left Arm, Patient Position: Sitting, Cuff Size: Normal)   Pulse 72   Temp (!) 97.5 F (36.4 C) (Oral)   Ht 5\' 8"  (1.727 m)   Wt 174 lb (78.9 kg)   SpO2 99%   BMI 26.46 kg/m   Attention deficit hyperactivity disorder (ADHD), unspecified ADHD type Assessment & Plan: Patient reports that he is doing well with current dose of Adderall.  He denies any new issues related to sleep, no issues with chest pain, palpitations, appetite concerns. Given progress with dose of Adderall, can continue with this at this time.  Refill sent to pharmacy earlier today.   Anxiety Assessment & Plan: Patient reports that he has been doing well with escitalopram.  Has not noticed any side effects, feels that he has been tolerating medication well.  He has noted improvement in symptoms. Can continue with current dose of medication, no changes today   Wellness examination -     CBC with Differential/Platelet; Future -     Comprehensive metabolic panel; Future -     Lipid panel; Future -     TSH Rfx on Abnormal to Free T4; Future  Return in about 3 months (around 11/28/2023) for CPE with fasting labs 1 week prior.   ___________________________________________ Rosina Cressler de [place], MD, ABFM, CAQSM Primary Care and Sports Medicine [facility]

## 2023-09-22 ENCOUNTER — Encounter (HOSPITAL_BASED_OUTPATIENT_CLINIC_OR_DEPARTMENT_OTHER): Payer: Self-pay | Admitting: Family Medicine

## 2023-10-07 ENCOUNTER — Other Ambulatory Visit (HOSPITAL_BASED_OUTPATIENT_CLINIC_OR_DEPARTMENT_OTHER): Payer: Self-pay | Admitting: Family Medicine

## 2023-10-07 DIAGNOSIS — F909 Attention-deficit hyperactivity disorder, unspecified type: Secondary | ICD-10-CM

## 2023-10-08 MED ORDER — AMPHETAMINE-DEXTROAMPHETAMINE 20 MG PO TABS: 40.0000 mg | ORAL_TABLET | Freq: Every day | ORAL | 0 refills | Status: DC

## 2023-10-08 MED ORDER — AMPHETAMINE-DEXTROAMPHETAMINE 10 MG PO TABS: 10.0000 mg | ORAL_TABLET | Freq: Every day | ORAL | 0 refills | Status: DC

## 2023-11-09 ENCOUNTER — Encounter (HOSPITAL_BASED_OUTPATIENT_CLINIC_OR_DEPARTMENT_OTHER): Payer: Self-pay | Admitting: Family Medicine

## 2023-11-09 ENCOUNTER — Other Ambulatory Visit (HOSPITAL_BASED_OUTPATIENT_CLINIC_OR_DEPARTMENT_OTHER): Payer: Self-pay | Admitting: Family Medicine

## 2023-11-09 DIAGNOSIS — F909 Attention-deficit hyperactivity disorder, unspecified type: Secondary | ICD-10-CM

## 2023-11-09 MED ORDER — AMPHETAMINE-DEXTROAMPHETAMINE 10 MG PO TABS: 10.0000 mg | ORAL_TABLET | Freq: Every day | ORAL | 0 refills | Status: DC

## 2023-11-09 MED ORDER — AMPHETAMINE-DEXTROAMPHETAMINE 20 MG PO TABS
40.0000 mg | ORAL_TABLET | Freq: Every day | ORAL | 0 refills | Status: DC
Start: 2023-11-09 — End: 2024-01-03

## 2023-11-24 ENCOUNTER — Encounter (HOSPITAL_BASED_OUTPATIENT_CLINIC_OR_DEPARTMENT_OTHER): Payer: Self-pay | Admitting: Family Medicine

## 2023-11-29 ENCOUNTER — Encounter (HOSPITAL_BASED_OUTPATIENT_CLINIC_OR_DEPARTMENT_OTHER): Payer: 59 | Admitting: Family Medicine

## 2023-12-04 ENCOUNTER — Other Ambulatory Visit (HOSPITAL_BASED_OUTPATIENT_CLINIC_OR_DEPARTMENT_OTHER): Payer: Self-pay | Admitting: Family Medicine

## 2024-01-01 ENCOUNTER — Encounter (HOSPITAL_BASED_OUTPATIENT_CLINIC_OR_DEPARTMENT_OTHER): Payer: Self-pay | Admitting: Family Medicine

## 2024-01-01 DIAGNOSIS — F909 Attention-deficit hyperactivity disorder, unspecified type: Secondary | ICD-10-CM

## 2024-01-03 MED ORDER — AMPHETAMINE-DEXTROAMPHETAMINE 20 MG PO TABS: 40.0000 mg | ORAL_TABLET | Freq: Every day | ORAL | 0 refills | Status: DC

## 2024-01-03 MED ORDER — AMPHETAMINE-DEXTROAMPHETAMINE 10 MG PO TABS: 10.0000 mg | ORAL_TABLET | Freq: Every day | ORAL | 0 refills | Status: DC

## 2024-02-02 MED ORDER — AMPHETAMINE-DEXTROAMPHETAMINE 20 MG PO TABS: 40.0000 mg | ORAL_TABLET | Freq: Every day | ORAL | 0 refills | Status: DC

## 2024-02-02 MED ORDER — AMPHETAMINE-DEXTROAMPHETAMINE 10 MG PO TABS: 10.0000 mg | ORAL_TABLET | Freq: Every day | ORAL | 0 refills | Status: DC

## 2024-02-03 ENCOUNTER — Encounter (HOSPITAL_BASED_OUTPATIENT_CLINIC_OR_DEPARTMENT_OTHER): Payer: Self-pay | Admitting: Family Medicine

## 2024-02-03 ENCOUNTER — Ambulatory Visit (INDEPENDENT_AMBULATORY_CARE_PROVIDER_SITE_OTHER): Admitting: Family Medicine

## 2024-02-03 VITALS — BP 133/84 | HR 93 | Ht 68.0 in | Wt 169.3 lb

## 2024-02-03 DIAGNOSIS — Z0000 Encounter for general adult medical examination without abnormal findings: Secondary | ICD-10-CM | POA: Diagnosis not present

## 2024-02-03 DIAGNOSIS — F909 Attention-deficit hyperactivity disorder, unspecified type: Secondary | ICD-10-CM

## 2024-02-03 NOTE — Assessment & Plan Note (Signed)
 Routine HCM labs ordered. HCM reviewed/discussed. Anticipatory guidance regarding healthy weight, lifestyle and choices given. Recommend healthy diet.  Recommend approximately 150 minutes/week of moderate intensity exercise Recommend regular dental and vision exams Always use seatbelt/lap and shoulder restraints Recommend using smoke alarms and checking batteries at least twice a year Recommend using sunscreen when outside Discussed tetanus immunization recommendations, patient is due but declines. Patient is UTD with HPV series

## 2024-02-03 NOTE — Assessment & Plan Note (Signed)
 Patient reports that he is doing well with current dose of Adderall.  He denies any new issues related to sleep, no issues with chest pain, palpitations, appetite concerns. Given progress with dose of Adderall, can continue with this at this time.  Refill sent to pharmacy earlier this week.

## 2024-02-03 NOTE — Patient Instructions (Signed)
  Medication Instructions:  Your physician recommends that you continue on your current medications as directed. Please refer to the Current Medication list given to you today. --If you need a refill on any your medications before your next appointment, please call your pharmacy first. If no refills are authorized on file call the office.-- Lab Work: Your physician has recommended that you have lab work today: fasting labs within the next few weeks  If you have labs (blood work) drawn today and your tests are completely normal, you will receive your results via MyChart message OR a phone call from our staff.  Please ensure you check your voicemail in the event that you authorized detailed messages to be left on a delegated number. If you have any lab test that is abnormal or we need to change your treatment, we will call you to review the results.  Follow-Up: Your next appointment:   Your physician recommends that you schedule a follow-up appointment in: 3 month follow up  with Dr. de [place]  You will receive a text message or e-mail with a link to a survey about your care and experience with us  today! We would greatly appreciate your feedback!   Thanks for letting us  be apart of your health journey!!  Primary Care and Sports Medicine   Dr. Quintin sheerer Peru   We encourage you to activate your patient portal called MyChart.  Sign up information is provided on this After Visit Summary.  MyChart is used to connect with patients for Virtual Visits (Telemedicine).  Patients are able to view lab/test results, encounter notes, upcoming appointments, etc.  Non-urgent messages can be sent to your provider as well. To learn more about what you can do with MyChart, please visit --  [email or web address].

## 2024-02-03 NOTE — Progress Notes (Signed)
 Subjective:    CC: Annual Physical Exam  HPI:  Jeffery Jennings is a 23 y.o. presenting for annual physical  I reviewed the past medical history, family history, social history, surgical history, and allergies today and no changes were needed.  Please see the problem list section below in epic for further details.  Past Medical History: Past Medical History:  Diagnosis Date   ADHD (attention deficit hyperactivity disorder)    Past Surgical History: Past Surgical History:  Procedure Laterality Date   CLOSED REDUCTION FINGER WITH PERCUTANEOUS PINNING Right 06/17/2022   Procedure: closed reduction percutaneous pinning right small finger proximal phalanx fracture;  Surgeon: Sissy Cough, MD;  Location: [facility] OR;  Service: Orthopedics;  Laterality: Right;   NO PAST SURGERIES     Social History: Social History   Socioeconomic History   Marital status: Single    Spouse name: Not on file   Number of children: Not on file   Years of education: Not on file   Highest education level: Not on file  Occupational History   Not on file  Tobacco Use   Smoking status: Never   Smokeless tobacco: Not on file  Vaping Use   Vaping status: Every Day   Substances: Nicotine, Flavoring  Substance and Sexual Activity   Alcohol use: Yes    Comment: rarely   Drug use: Not Currently   Sexual activity: Not on file  Other Topics Concern   Not on file  Social History Narrative   Not on file   Social Drivers of Health   Financial Resource Strain: Not on file  Food Insecurity: Not on file  Transportation Needs: Not on file  Physical Activity: Not on file  Stress: Not on file  Social Connections: Not on file   Family History: Family History  Problem Relation Age of Onset   Hypertension Father    Allergies: No Known Allergies Medications: See med rec.  Review of Systems: No headache, visual changes, nausea, vomiting, diarrhea, constipation, dizziness, abdominal pain, skin rash,  fevers, chills, night sweats, swollen lymph nodes, weight loss, chest pain, body aches, joint swelling, muscle aches, shortness of breath, mood changes, visual or auditory hallucinations.  Objective:    BP 133/84 (BP Location: Right Arm, Patient Position: Sitting, Cuff Size: Normal)   Pulse 93   Ht 5' 8 (1.727 m)   Wt 169 lb 4.8 oz (76.8 kg)   SpO2 97%   BMI 25.74 kg/m   General: Well Developed, well nourished, and in no acute distress. Neuro: Alert and oriented x3, extra-ocular muscles intact, sensation grossly intact. Cranial nerves II through XII are intact, motor, sensory, and coordinative functions are all intact. HEENT: Normocephalic, atraumatic, pupils equal round reactive to light, neck supple, no masses, no lymphadenopathy, thyroid nonpalpable. Oropharynx, nasopharynx, external ear canals are unremarkable. Skin: Warm and dry, no rashes noted. Cardiac: Regular rate and rhythm, no murmurs rubs or gallops. Respiratory: Clear to auscultation bilaterally. Not using accessory muscles, speaking in full sentences. Abdominal: Soft, nontender, nondistended, positive bowel sounds, no masses, no organomegaly. Musculoskeletal: Shoulder, elbow, wrist, hip, knee, ankle stable, and with full range of motion.  Impression and Recommendations:    Wellness examination Assessment & Plan: Routine HCM labs ordered. HCM reviewed/discussed. Anticipatory guidance regarding healthy weight, lifestyle and choices given. Recommend healthy diet.  Recommend approximately 150 minutes/week of moderate intensity exercise Recommend regular dental and vision exams Always use seatbelt/lap and shoulder restraints Recommend using smoke alarms and checking batteries at least twice a  year Recommend using sunscreen when outside Discussed tetanus immunization recommendations, patient is due but declines. Patient is UTD with HPV series   Attention deficit hyperactivity disorder (ADHD), unspecified ADHD  type Assessment & Plan: Patient reports that he is doing well with current dose of Adderall.  He denies any new issues related to sleep, no issues with chest pain, palpitations, appetite concerns. Given progress with dose of Adderall, can continue with this at this time.  Refill sent to pharmacy earlier this week.   Return in about 3 months (around 05/05/2024) for med check.   ___________________________________________ Christia Domke de [place], MD, ABFM, [facility] Primary Care and Sports Medicine [facility]

## 2024-02-16 ENCOUNTER — Other Ambulatory Visit (HOSPITAL_BASED_OUTPATIENT_CLINIC_OR_DEPARTMENT_OTHER): Payer: Self-pay | Admitting: *Deleted

## 2024-02-16 DIAGNOSIS — Z0000 Encounter for general adult medical examination without abnormal findings: Secondary | ICD-10-CM

## 2024-02-17 LAB — CBC WITH DIFFERENTIAL/PLATELET
Basophils Absolute: 0.1 x10E3/uL (ref 0.0–0.2)
Basos: 1 %
EOS (ABSOLUTE): 0.5 x10E3/uL — ABNORMAL HIGH (ref 0.0–0.4)
Eos: 5 %
Hematocrit: 50.9 % (ref 37.5–51.0)
Hemoglobin: 16.6 g/dL (ref 13.0–17.7)
Immature Grans (Abs): 0 x10E3/uL (ref 0.0–0.1)
Immature Granulocytes: 0 %
Lymphocytes Absolute: 3.2 x10E3/uL — ABNORMAL HIGH (ref 0.7–3.1)
Lymphs: 37 %
MCH: 30.1 pg (ref 26.6–33.0)
MCHC: 32.6 g/dL (ref 31.5–35.7)
MCV: 92 fL (ref 79–97)
Monocytes Absolute: 0.6 x10E3/uL (ref 0.1–0.9)
Monocytes: 7 %
Neutrophils Absolute: 4.4 x10E3/uL (ref 1.4–7.0)
Neutrophils: 49 %
Platelets: 278 x10E3/uL (ref 150–450)
RBC: 5.51 x10E6/uL (ref 4.14–5.80)
RDW: 12.1 % (ref 11.6–15.4)
WBC: 8.8 x10E3/uL (ref 3.4–10.8)

## 2024-02-17 LAB — TSH RFX ON ABNORMAL TO FREE T4: TSH: 1.27 u[IU]/mL (ref 0.450–4.500)

## 2024-02-17 LAB — COMPREHENSIVE METABOLIC PANEL WITH GFR
ALT: 47 IU/L — ABNORMAL HIGH (ref 0–44)
AST: 25 IU/L (ref 0–40)
Albumin: 4.7 g/dL (ref 4.3–5.2)
Alkaline Phosphatase: 54 IU/L (ref 44–121)
BUN/Creatinine Ratio: 14 (ref 9–20)
BUN: 11 mg/dL (ref 6–20)
Bilirubin Total: 0.2 mg/dL (ref 0.0–1.2)
CO2: 23 mmol/L (ref 20–29)
Calcium: 10.4 mg/dL — ABNORMAL HIGH (ref 8.7–10.2)
Chloride: 101 mmol/L (ref 96–106)
Creatinine, Ser: 0.78 mg/dL (ref 0.76–1.27)
Globulin, Total: 2.1 g/dL (ref 1.5–4.5)
Glucose: 91 mg/dL (ref 70–99)
Potassium: 4.4 mmol/L (ref 3.5–5.2)
Sodium: 139 mmol/L (ref 134–144)
Total Protein: 6.8 g/dL (ref 6.0–8.5)
eGFR: 129 mL/min/1.73 (ref >59)

## 2024-02-17 LAB — LIPID PANEL
Chol/HDL Ratio: 4.6 ratio (ref 0.0–5.0)
Cholesterol, Total: 202 mg/dL — ABNORMAL HIGH (ref 100–199)
HDL: 44 mg/dL (ref >39)
LDL Chol Calc (NIH): 135 mg/dL — ABNORMAL HIGH (ref 0–99)
Triglycerides: 128 mg/dL (ref 0–149)
VLDL Cholesterol Cal: 23 mg/dL (ref 5–40)

## 2024-04-02 ENCOUNTER — Encounter (HOSPITAL_BASED_OUTPATIENT_CLINIC_OR_DEPARTMENT_OTHER): Payer: Self-pay | Admitting: Family Medicine

## 2024-04-02 DIAGNOSIS — F909 Attention-deficit hyperactivity disorder, unspecified type: Secondary | ICD-10-CM

## 2024-04-06 MED ORDER — AMPHETAMINE-DEXTROAMPHETAMINE 10 MG PO TABS: 10.0000 mg | ORAL_TABLET | Freq: Every day | ORAL | 0 refills | Status: DC

## 2024-04-06 MED ORDER — AMPHETAMINE-DEXTROAMPHETAMINE 20 MG PO TABS: 40.0000 mg | ORAL_TABLET | Freq: Every day | ORAL | 0 refills | Status: DC

## 2024-05-08 ENCOUNTER — Encounter (HOSPITAL_BASED_OUTPATIENT_CLINIC_OR_DEPARTMENT_OTHER): Payer: Self-pay

## 2024-05-08 ENCOUNTER — Ambulatory Visit (HOSPITAL_BASED_OUTPATIENT_CLINIC_OR_DEPARTMENT_OTHER): Payer: Self-pay | Admitting: Family Medicine

## 2024-05-16 ENCOUNTER — Encounter (HOSPITAL_BASED_OUTPATIENT_CLINIC_OR_DEPARTMENT_OTHER): Payer: Self-pay | Admitting: Family Medicine

## 2024-05-16 DIAGNOSIS — F909 Attention-deficit hyperactivity disorder, unspecified type: Secondary | ICD-10-CM

## 2024-05-16 MED ORDER — AMPHETAMINE-DEXTROAMPHETAMINE 20 MG PO TABS: 40.0000 mg | ORAL_TABLET | Freq: Every day | ORAL | 0 refills | Status: AC

## 2024-05-16 MED ORDER — AMPHETAMINE-DEXTROAMPHETAMINE 10 MG PO TABS: 10.0000 mg | ORAL_TABLET | Freq: Every day | ORAL | 0 refills | Status: AC

## 2024-05-18 ENCOUNTER — Ambulatory Visit (HOSPITAL_BASED_OUTPATIENT_CLINIC_OR_DEPARTMENT_OTHER): Payer: Self-pay | Admitting: Family Medicine

## 2024-08-17 ENCOUNTER — Other Ambulatory Visit (HOSPITAL_BASED_OUTPATIENT_CLINIC_OR_DEPARTMENT_OTHER): Payer: Self-pay

## 2024-09-13 ENCOUNTER — Ambulatory Visit (HOSPITAL_BASED_OUTPATIENT_CLINIC_OR_DEPARTMENT_OTHER): Payer: Self-pay | Admitting: Family Medicine
